# Patient Record
Sex: Female | Born: 1980 | Race: White | Hispanic: No | Marital: Married | Smoking: Never smoker
Health system: Southern US, Community
[De-identification: ages and names within clinical notes are randomized; demographics above are authoritative.]

## PROBLEM LIST (undated history)

## (undated) HISTORY — PX: KIDNEY SURGERY: SHX687

## (undated) HISTORY — PX: CHOLECYSTECTOMY: SHX55

---

## 2005-02-18 ENCOUNTER — Emergency Department: Payer: Self-pay | Admitting: Internal Medicine

## 2006-03-21 ENCOUNTER — Emergency Department: Payer: Self-pay | Admitting: Emergency Medicine

## 2006-03-22 ENCOUNTER — Ambulatory Visit: Payer: Self-pay | Admitting: Emergency Medicine

## 2006-03-29 ENCOUNTER — Ambulatory Visit: Payer: Self-pay

## 2006-04-03 ENCOUNTER — Ambulatory Visit: Payer: Self-pay | Admitting: Unknown Physician Specialty

## 2006-11-13 ENCOUNTER — Ambulatory Visit: Payer: Self-pay | Admitting: Obstetrics & Gynecology

## 2006-11-20 ENCOUNTER — Ambulatory Visit: Payer: Self-pay | Admitting: Obstetrics & Gynecology

## 2006-12-17 ENCOUNTER — Encounter: Payer: Self-pay | Admitting: Maternal & Fetal Medicine

## 2006-12-24 ENCOUNTER — Encounter: Payer: Self-pay | Admitting: Maternal & Fetal Medicine

## 2006-12-31 ENCOUNTER — Encounter: Payer: Self-pay | Admitting: Maternal & Fetal Medicine

## 2006-12-31 ENCOUNTER — Observation Stay: Payer: Self-pay | Admitting: Obstetrics and Gynecology

## 2007-01-14 ENCOUNTER — Encounter: Payer: Self-pay | Admitting: Maternal & Fetal Medicine

## 2007-01-20 ENCOUNTER — Inpatient Hospital Stay: Payer: Self-pay

## 2012-10-14 ENCOUNTER — Emergency Department: Payer: Self-pay | Admitting: Emergency Medicine

## 2012-10-14 LAB — URINALYSIS, COMPLETE
Glucose,UR: NEGATIVE mg/dL (ref 0–75)
Hyaline Cast: 4
Leukocyte Esterase: NEGATIVE
Protein: 30
Specific Gravity: 1.035 (ref 1.003–1.030)
WBC UR: 8 /HPF (ref 0–5)

## 2012-10-14 LAB — COMPREHENSIVE METABOLIC PANEL
Alkaline Phosphatase: 78 U/L (ref 50–136)
Anion Gap: 8 (ref 7–16)
BUN: 12 mg/dL (ref 7–18)
Bilirubin,Total: 0.4 mg/dL (ref 0.2–1.0)
Chloride: 102 mmol/L (ref 98–107)
Co2: 26 mmol/L (ref 21–32)
EGFR (African American): >60
EGFR (Non-African Amer.): >60
Glucose: 98 mg/dL (ref 65–99)
Osmolality: 272 (ref 275–301)
Potassium: 3.7 mmol/L (ref 3.5–5.1)
SGPT (ALT): 15 U/L (ref 12–78)
Sodium: 136 mmol/L (ref 136–145)

## 2012-10-14 LAB — CBC
MCH: 30.2 pg (ref 26.0–34.0)
Platelet: 168 10*3/uL (ref 150–440)
RBC: 4.85 10*6/uL (ref 3.80–5.20)
RDW: 13.3 % (ref 11.5–14.5)
WBC: 7.3 10*3/uL (ref 3.6–11.0)

## 2012-10-14 LAB — RAPID INFLUENZA A&B ANTIGENS

## 2014-06-14 ENCOUNTER — Ambulatory Visit: Payer: Self-pay

## 2019-12-20 ENCOUNTER — Other Ambulatory Visit: Payer: Self-pay

## 2019-12-20 ENCOUNTER — Ambulatory Visit: Payer: Self-pay | Attending: Internal Medicine

## 2019-12-20 ENCOUNTER — Ambulatory Visit: Payer: Self-pay

## 2019-12-20 DIAGNOSIS — Z23 Encounter for immunization: Secondary | ICD-10-CM | POA: Insufficient documentation

## 2019-12-20 NOTE — Progress Notes (Signed)
   Covid-19 Vaccination Clinic  Name:  Carrie Webb    MRN: [id number] DOB: [birth year]  12/20/2019  Carrie Webb was observed post Covid-19 immunization for 15 minutes without incidence. She was provided with Vaccine Information Sheet and instruction to access the V-Safe system.   Carrie Webb was instructed to call 911 with any severe reactions post vaccine: Marland Kitchen Difficulty breathing  . Swelling of your face and throat  . A fast heartbeat  . A bad rash all over your body  . Dizziness and weakness    Immunizations Administered    Name Date Dose VIS Date Route   Moderna COVID-19 Vaccine 12/20/2019  2:00 PM 0.5 mL 09/23/2019 Intramuscular   Manufacturer: Moderna   Lot: [id number]   NDC: 73220-254-27

## 2020-01-17 ENCOUNTER — Ambulatory Visit: Payer: Self-pay | Attending: Internal Medicine

## 2020-01-17 DIAGNOSIS — Z23 Encounter for immunization: Secondary | ICD-10-CM

## 2020-01-17 NOTE — Progress Notes (Signed)
   Covid-19 Vaccination Clinic  Name:  Carrie Webb    MRN: [id number] DOB: 06-28-1981  01/17/2020  Carrie Webb was observed post Covid-19 immunization for 15 minutes without incident. She was provided with Vaccine Information Sheet and instruction to access the V-Safe system.   Carrie Webb was instructed to call 911 with any severe reactions post vaccine: Marland Kitchen Difficulty breathing  . Swelling of face and throat  . A fast heartbeat  . A bad rash all over body  . Dizziness and weakness   Immunizations Administered    Name Date Dose VIS Date Route   Moderna COVID-19 Vaccine 01/17/2020 10:19 AM 0.5 mL 09/23/2019 Intramuscular   Manufacturer: Gala Murdoch   Lot: [id number]   NDC: 22979-892-11

## 2020-03-12 ENCOUNTER — Observation Stay
Admission: EM | Admit: 2020-03-12 | Discharge: 2020-03-14 | Disposition: A | Discharge status: Home / Self Care | Payer: 59 | Attending: Surgery | Admitting: Surgery

## 2020-03-12 ENCOUNTER — Encounter: Payer: Self-pay | Admitting: Medical Oncology

## 2020-03-12 ENCOUNTER — Other Ambulatory Visit: Payer: Self-pay

## 2020-03-12 ENCOUNTER — Ambulatory Visit
Admission: EM | Admit: 2020-03-12 | Discharge: 2020-03-12 | Disposition: A | Discharge status: Home / Self Care | Payer: 59 | Source: Home / Self Care | Attending: Family Medicine | Admitting: Family Medicine

## 2020-03-12 ENCOUNTER — Encounter: Payer: Self-pay | Admitting: Emergency Medicine

## 2020-03-12 ENCOUNTER — Emergency Department: Payer: 59

## 2020-03-12 DIAGNOSIS — Z886 Allergy status to analgesic agent status: Secondary | ICD-10-CM | POA: Diagnosis not present

## 2020-03-12 DIAGNOSIS — R198 Other specified symptoms and signs involving the digestive system and abdomen: Secondary | ICD-10-CM

## 2020-03-12 DIAGNOSIS — R1011 Right upper quadrant pain: Secondary | ICD-10-CM

## 2020-03-12 DIAGNOSIS — K8020 Calculus of gallbladder without cholecystitis without obstruction: Secondary | ICD-10-CM | POA: Diagnosis present

## 2020-03-12 DIAGNOSIS — K8010 Calculus of gallbladder with chronic cholecystitis without obstruction: Principal | ICD-10-CM | POA: Insufficient documentation

## 2020-03-12 DIAGNOSIS — R112 Nausea with vomiting, unspecified: Secondary | ICD-10-CM

## 2020-03-12 DIAGNOSIS — K810 Acute cholecystitis: Secondary | ICD-10-CM | POA: Diagnosis present

## 2020-03-12 DIAGNOSIS — Z880 Allergy status to penicillin: Secondary | ICD-10-CM | POA: Diagnosis not present

## 2020-03-12 DIAGNOSIS — Z20822 Contact with and (suspected) exposure to covid-19: Secondary | ICD-10-CM | POA: Diagnosis not present

## 2020-03-12 LAB — CBC
HCT: 42.3 % (ref 36.0–46.0)
Hemoglobin: 14.3 g/dL (ref 12.0–15.0)
MCH: 30.4 pg (ref 26.0–34.0)
MCHC: 33.8 g/dL (ref 30.0–36.0)
MCV: 90 fL (ref 80.0–100.0)
Platelets: 249 10*3/uL (ref 150–400)
RBC: 4.7 MIL/uL (ref 3.87–5.11)
RDW: 13.1 % (ref 11.5–15.5)
WBC: 9.4 10*3/uL (ref 4.0–10.5)
nRBC: 0 % (ref 0.0–0.2)

## 2020-03-12 LAB — URINALYSIS, COMPLETE (UACMP) WITH MICROSCOPIC
Bilirubin Urine: NEGATIVE
Glucose, UA: NEGATIVE mg/dL
Ketones, ur: NEGATIVE mg/dL
Leukocytes,Ua: NEGATIVE
Nitrite: NEGATIVE
Protein, ur: NEGATIVE mg/dL
Specific Gravity, Urine: 1.025 (ref 1.005–1.030)
pH: 7 (ref 5.0–8.0)

## 2020-03-12 LAB — COMPREHENSIVE METABOLIC PANEL
ALT: 11 U/L (ref 0–44)
AST: 15 U/L (ref 15–41)
Albumin: 4.3 g/dL (ref 3.5–5.0)
Alkaline Phosphatase: 59 U/L (ref 38–126)
Anion gap: 10 (ref 5–15)
BUN: 12 mg/dL (ref 6–20)
CO2: 25 mmol/L (ref 22–32)
Calcium: 9 mg/dL (ref 8.9–10.3)
Chloride: 103 mmol/L (ref 98–111)
Creatinine, Ser: 0.82 mg/dL (ref 0.44–1.00)
GFR calc Af Amer: >60 mL/min (ref >60)
GFR calc non Af Amer: >60 mL/min (ref >60)
Glucose, Bld: 99 mg/dL (ref 70–99)
Potassium: 3.7 mmol/L (ref 3.5–5.1)
Sodium: 138 mmol/L (ref 135–145)
Total Bilirubin: 0.6 mg/dL (ref 0.3–1.2)
Total Protein: 8.2 g/dL — ABNORMAL HIGH (ref 6.5–8.1)

## 2020-03-12 LAB — POCT PREGNANCY, URINE: Preg Test, Ur: NEGATIVE

## 2020-03-12 LAB — LIPASE, BLOOD: Lipase: 24 U/L (ref 11–51)

## 2020-03-12 LAB — SARS CORONAVIRUS 2 BY RT PCR (HOSPITAL ORDER, PERFORMED IN ~~LOC~~ HOSPITAL LAB): SARS Coronavirus 2: NEGATIVE

## 2020-03-12 MED ORDER — SODIUM CHLORIDE 0.9 % IV SOLN: INTRAVENOUS | Status: DC

## 2020-03-12 MED ORDER — METRONIDAZOLE IN NACL 5-0.79 MG/ML-% IV SOLN
500.0000 mg | Freq: Once | INTRAVENOUS | Status: AC
  Administered 2020-03-12: 500 mg via INTRAVENOUS
  Filled 2020-03-12: qty 100

## 2020-03-12 MED ORDER — ONDANSETRON 4 MG PO TBDP
4.0000 mg | ORAL_TABLET | Freq: Once | ORAL | Status: AC | PRN
  Administered 2020-03-12: 4 mg via ORAL
  Filled 2020-03-12: qty 1

## 2020-03-12 MED ORDER — PROCHLORPERAZINE MALEATE 10 MG PO TABS
10.0000 mg | ORAL_TABLET | Freq: Four times a day (QID) | ORAL | Status: DC | PRN
  Filled 2020-03-12: qty 1

## 2020-03-12 MED ORDER — PANTOPRAZOLE SODIUM 40 MG IV SOLR
40.0000 mg | Freq: Every day | INTRAVENOUS | Status: DC
  Administered 2020-03-12 – 2020-03-13 (×2): 40 mg via INTRAVENOUS
  Filled 2020-03-12 (×3): qty 40

## 2020-03-12 MED ORDER — OXYCODONE HCL 5 MG PO TABS
5.0000 mg | ORAL_TABLET | ORAL | Status: DC | PRN
  Administered 2020-03-13: 5 mg via ORAL
  Filled 2020-03-12: qty 1
  Filled 2020-03-12: qty 2

## 2020-03-12 MED ORDER — HYDROMORPHONE HCL 1 MG/ML IJ SOLN
0.5000 mg | INTRAMUSCULAR | Status: DC | PRN
  Filled 2020-03-12: qty 1

## 2020-03-12 MED ORDER — ONDANSETRON HCL 4 MG/2ML IJ SOLN: 4.0000 mg | Freq: Four times a day (QID) | INTRAMUSCULAR | Status: DC | PRN

## 2020-03-12 MED ORDER — HYDRALAZINE HCL 20 MG/ML IJ SOLN: 10.0000 mg | INTRAMUSCULAR | Status: DC | PRN

## 2020-03-12 MED ORDER — MORPHINE SULFATE (PF) 2 MG/ML IV SOLN: 2.0000 mg | INTRAVENOUS | Status: DC | PRN

## 2020-03-12 MED ORDER — ACETAMINOPHEN 500 MG PO TABS
1000.0000 mg | ORAL_TABLET | Freq: Four times a day (QID) | ORAL | Status: DC
  Administered 2020-03-12 – 2020-03-14 (×5): 1000 mg via ORAL
  Filled 2020-03-12 (×6): qty 2

## 2020-03-12 MED ORDER — HEPARIN SODIUM (PORCINE) 5000 UNIT/ML IJ SOLN
5000.0000 [IU] | Freq: Three times a day (TID) | INTRAMUSCULAR | Status: DC
  Administered 2020-03-12 – 2020-03-14 (×4): 5000 [IU] via SUBCUTANEOUS
  Filled 2020-03-12 (×7): qty 1

## 2020-03-12 MED ORDER — PROCHLORPERAZINE EDISYLATE 10 MG/2ML IJ SOLN
5.0000 mg | Freq: Four times a day (QID) | INTRAMUSCULAR | Status: DC | PRN
  Administered 2020-03-12: 10 mg via INTRAVENOUS
  Filled 2020-03-12: qty 2
  Filled 2020-03-12: qty 1
  Filled 2020-03-12: qty 2

## 2020-03-12 MED ORDER — ONDANSETRON 4 MG PO TBDP: 4.0000 mg | ORAL_TABLET | Freq: Four times a day (QID) | ORAL | Status: DC | PRN

## 2020-03-12 MED ORDER — ONDANSETRON HCL 4 MG/2ML IJ SOLN
4.0000 mg | Freq: Once | INTRAMUSCULAR | Status: AC
  Administered 2020-03-12: 4 mg via INTRAVENOUS
  Filled 2020-03-12: qty 2

## 2020-03-12 MED ORDER — DIPHENHYDRAMINE HCL 50 MG/ML IJ SOLN: 12.5000 mg | Freq: Four times a day (QID) | INTRAMUSCULAR | Status: DC | PRN

## 2020-03-12 MED ORDER — CIPROFLOXACIN IN D5W 400 MG/200ML IV SOLN
400.0000 mg | Freq: Once | INTRAVENOUS | Status: DC
  Filled 2020-03-12: qty 200

## 2020-03-12 MED ORDER — CIPROFLOXACIN IN D5W 400 MG/200ML IV SOLN
400.0000 mg | Freq: Two times a day (BID) | INTRAVENOUS | Status: DC
  Administered 2020-03-12 – 2020-03-14 (×4): 400 mg via INTRAVENOUS
  Filled 2020-03-12 (×5): qty 200

## 2020-03-12 MED ORDER — SODIUM CHLORIDE 0.9 % IV BOLUS
1000.0000 mL | Freq: Once | INTRAVENOUS | Status: AC
  Administered 2020-03-12: 1000 mL via INTRAVENOUS

## 2020-03-12 MED ORDER — DIPHENHYDRAMINE HCL 12.5 MG/5ML PO ELIX
12.5000 mg | ORAL_SOLUTION | Freq: Four times a day (QID) | ORAL | Status: DC | PRN
  Filled 2020-03-12: qty 5

## 2020-03-12 MED ORDER — KETOROLAC TROMETHAMINE 15 MG/ML IJ SOLN
15.0000 mg | Freq: Four times a day (QID) | INTRAMUSCULAR | Status: DC | PRN
  Administered 2020-03-12: 15 mg via INTRAVENOUS
  Filled 2020-03-12 (×2): qty 1

## 2020-03-12 MED ORDER — OXYCODONE-ACETAMINOPHEN 5-325 MG PO TABS
1.0000 | ORAL_TABLET | ORAL | Status: DC | PRN
  Administered 2020-03-12: 1 via ORAL
  Filled 2020-03-12: qty 1

## 2020-03-12 NOTE — ED Triage Notes (Signed)
Patient c/o right upper abdominal pain that radiates to her right sided back that started yesterday.  Patient denies fevers.  Patient reports some N/V.

## 2020-03-12 NOTE — ED Notes (Signed)
Assigned bed @ 1735, spoke with RN Amy 

## 2020-03-12 NOTE — Discharge Instructions (Addendum)
Recommend patient go to Emergency Department for further evaluation and management °

## 2020-03-12 NOTE — ED Provider Notes (Signed)
[facility] Emergency Department Provider Note  ____________________________________________   First MD Initiated Contact with Patient 03/12/20 1613     (approximate)  I have reviewed the triage vital signs and the nursing notes.   HISTORY  Chief Complaint Abdominal Pain and Nausea    HPI Carrie Webb is a 39 y.o. female prior C-section with tubal ligation who comes in with abdominal pain.  Patient reports right upper abdominal pain that started yesterday, severe, constant, nothing makes better, nothing makes it worse.  Has been associate with some nausea and vomiting.  Denies ever having this previously.  Denies any chest pain, shortness of breath          History reviewed. No pertinent past medical history.  There are no problems to display for this patient.   Past Surgical History:  Procedure Laterality Date  . CESAREAN SECTION    . KIDNEY SURGERY      Prior to Admission medications   Not on File    Allergies Aspirin and Penicillin g  Family History  Problem Relation Age of Onset  . Healthy Mother   . Healthy Father     Social History Social History   Tobacco Use  . Smoking status: Never Smoker  . Smokeless tobacco: Never Used  Substance Use Topics  . Alcohol use: Not Currently  . Drug use: Never      Review of Systems Constitutional: No fever/chills Eyes: No visual changes. ENT: No sore throat. Cardiovascular: Denies chest pain. Respiratory: Denies shortness of breath. Gastrointestinal: Positive abdominal pain, nausea, vomiting no diarrhea.  No constipation. Genitourinary: Negative for dysuria. Musculoskeletal: Negative for back pain. Skin: Negative for rash. Neurological: Negative for headaches, focal weakness or numbness. All other ROS negative ____________________________________________   PHYSICAL EXAM:  VITAL SIGNS: ED Triage Vitals [03/12/20 1404]  Enc Vitals Group     BP 120/76     Pulse Rate 71      Resp 18     Temp 98.7 F (37.1 C)     Temp Source Oral     SpO2 96 %     Weight 160 lb (72.6 kg)     Height 5\' 4"  (1.626 m)     Head Circumference      Peak Flow      Pain Score 8     Pain Loc      Pain Edu?      Excl. in GC?     Constitutional: Alert and oriented. Well appearing and in no acute distress. Eyes: Conjunctivae are normal. EOMI. Head: Atraumatic. Nose: No congestion/rhinnorhea. Mouth/Throat: Mucous membranes are moist.   Neck: No stridor. Trachea Midline. FROM Cardiovascular: Normal rate, regular rhythm. Grossly normal heart sounds.  Good peripheral circulation. Respiratory: Normal respiratory effort.  No retractions. Lungs CTAB. Gastrointestinal: Positive right upper quadrant pain no distention. No abdominal bruits.  Musculoskeletal: No lower extremity tenderness nor edema.  No joint effusions. Neurologic:  Normal speech and language. No gross focal neurologic deficits are appreciated.  Skin:  Skin is warm, dry and intact. No rash noted. Psychiatric: Mood and affect are normal. Speech and behavior are normal. GU: Deferred   ____________________________________________   LABS (all labs ordered are listed, but only abnormal results are displayed)  Labs Reviewed  COMPREHENSIVE METABOLIC PANEL - Abnormal; Notable for the following components:      Result Value   Total Protein 8.2 (*)    All other components within normal limits  SARS CORONAVIRUS 2 BY  RT PCR (HOSPITAL ORDER, [facility])  LIPASE, BLOOD  CBC  URINALYSIS, COMPLETE (UACMP) WITH MICROSCOPIC  POC URINE PREG, ED   ____________________________________________   ED ECG REPORT I, Vanessa Spencer, the attending physician, personally viewed and interpreted this ECG.  Normal sinus rate of 77, no ST elevation, T wave version in lead III, normal intervals ____________________________________________  RADIOLOGY   Official radiology report(s): US ABDOMEN LIMITED  RUQ  Result Date: 03/12/2020 CLINICAL DATA:  Right upper quadrant pain. EXAM: ULTRASOUND ABDOMEN LIMITED RIGHT UPPER QUADRANT COMPARISON:  No prior. FINDINGS: Gallbladder: 8 mm stone noted in the neck of the gallbladder. 3 mm polyp noted. Gallbladder wall thickness 2.0 mm. Positive Murphy sign. Common bile duct: Diameter: 4.7 mm Liver: Increased echogenicity consistent fatty infiltration or hepatocellular disease. Portal vein is patent on color Doppler imaging with normal direction of blood flow towards the liver. Other: None. IMPRESSION: 1. 8 mm stone noted the neck of the gallbladder. 3 mm polyp. No gallbladder wall thickening. Positive ultrasound Murphy sign. No biliary distention. 2. Increased hepatic echogenicity consistent with fatty infiltration or hepatocellular disease. Electronically Signed   By: Marcello Moores  Register   On: 03/12/2020 16:10    ____________________________________________   PROCEDURES  Procedure(s) performed (including Critical Care):  Procedures   ____________________________________________   INITIAL IMPRESSION / ASSESSMENT AND PLAN / ED COURSE  Carrie Webb was evaluated in Emergency Department on 03/12/2020 for the symptoms described in the history of present illness. She was evaluated in the context of the global COVID-19 pandemic, which necessitated consideration that the patient might be at risk for infection with the SARS-CoV-2 virus that causes COVID-19. Institutional protocols and algorithms that pertain to the evaluation of patients at risk for COVID-19 are in a state of rapid change based on information released by regulatory bodies including the CDC and federal and state organizations. These policies and algorithms were followed during the patient's care in the ED.    Patient is a 39 year old who comes in with right upper quadrant pain.  Will get ultrasound evaluate for cholecystitis, gallstone, labs evaluate for choledocholithiasis.  No lower abdominal pain  to suggest appendicitis, SBO  Labs are reassuring.  Ultrasound shows gallstone with positive Murphy sign.  Given patient's continued pain and nausea and vomiting for almost 24 hours will discuss with the surgery team.  Discussed with Dr. Dahlia Byes who will admit patient for cholecystectomy       ____________________________________________   FINAL CLINICAL IMPRESSION(S) / ED DIAGNOSES   Final diagnoses:  RUQ pain  Calculus of gallbladder without cholecystitis without obstruction  Nausea and vomiting, intractability of vomiting not specified, unspecified vomiting type      MEDICATIONS GIVEN DURING THIS VISIT:  Medications  sodium chloride 0.9 % bolus 1,000 mL (has no administration in time range)  HYDROmorphone (DILAUDID) injection 0.5 mg (has no administration in time range)  ondansetron (ZOFRAN) injection 4 mg (has no administration in time range)  metroNIDAZOLE (FLAGYL) IVPB 500 mg (has no administration in time range)  ciprofloxacin (CIPRO) IVPB 400 mg (has no administration in time range)  ondansetron (ZOFRAN-ODT) disintegrating tablet 4 mg (4 mg Oral Given 03/12/20 1419)     ED Discharge Orders    None       Note:  This document was prepared using Dragon voice recognition software and may include unintentional dictation errors.   Vanessa Stanton, MD 03/12/20 [phone]

## 2020-03-12 NOTE — ED Triage Notes (Signed)
Pt c/o rt upper quad abd pain that began yesterday and radiates around to rt flank. Pt reports nausea and vomiting. Pt denies fever.

## 2020-03-12 NOTE — ED Notes (Signed)
Report called to [facility] rn floor nurse.

## 2020-03-13 ENCOUNTER — Encounter
Admission: EM | Disposition: A | Discharge status: Home / Self Care | Payer: Self-pay | Source: Home / Self Care | Attending: Emergency Medicine

## 2020-03-13 ENCOUNTER — Observation Stay: Payer: 59 | Admitting: Anesthesiology

## 2020-03-13 DIAGNOSIS — K810 Acute cholecystitis: Secondary | ICD-10-CM | POA: Diagnosis not present

## 2020-03-13 LAB — CBC
HCT: 35 % — ABNORMAL LOW (ref 36.0–46.0)
Hemoglobin: 11.8 g/dL — ABNORMAL LOW (ref 12.0–15.0)
MCH: 30.6 pg (ref 26.0–34.0)
MCHC: 33.7 g/dL (ref 30.0–36.0)
MCV: 90.7 fL (ref 80.0–100.0)
Platelets: 191 10*3/uL (ref 150–400)
RBC: 3.86 MIL/uL — ABNORMAL LOW (ref 3.87–5.11)
RDW: 13.1 % (ref 11.5–15.5)
WBC: 5.6 10*3/uL (ref 4.0–10.5)
nRBC: 0 % (ref 0.0–0.2)

## 2020-03-13 LAB — COMPREHENSIVE METABOLIC PANEL
ALT: 9 U/L (ref 0–44)
AST: 13 U/L — ABNORMAL LOW (ref 15–41)
Albumin: 3 g/dL — ABNORMAL LOW (ref 3.5–5.0)
Alkaline Phosphatase: 38 U/L (ref 38–126)
Anion gap: 5 (ref 5–15)
BUN: 8 mg/dL (ref 6–20)
CO2: 23 mmol/L (ref 22–32)
Calcium: 7.8 mg/dL — ABNORMAL LOW (ref 8.9–10.3)
Chloride: 110 mmol/L (ref 98–111)
Creatinine, Ser: 0.77 mg/dL (ref 0.44–1.00)
GFR calc Af Amer: >60 mL/min (ref >60)
GFR calc non Af Amer: >60 mL/min (ref >60)
Glucose, Bld: 104 mg/dL — ABNORMAL HIGH (ref 70–99)
Potassium: 3.6 mmol/L (ref 3.5–5.1)
Sodium: 138 mmol/L (ref 135–145)
Total Bilirubin: 0.8 mg/dL (ref 0.3–1.2)
Total Protein: 5.6 g/dL — ABNORMAL LOW (ref 6.5–8.1)

## 2020-03-13 LAB — HIV ANTIBODY (ROUTINE TESTING W REFLEX): HIV Screen 4th Generation wRfx: NONREACTIVE

## 2020-03-13 SURGERY — CHOLECYSTECTOMY, ROBOT-ASSISTED, LAPAROSCOPIC
Anesthesia: General

## 2020-03-13 MED ORDER — FENTANYL CITRATE (PF) 100 MCG/2ML IJ SOLN
INTRAMUSCULAR | Status: AC
  Filled 2020-03-13: qty 2

## 2020-03-13 MED ORDER — MIDAZOLAM HCL 2 MG/2ML IJ SOLN
INTRAMUSCULAR | Status: AC
  Filled 2020-03-13: qty 2

## 2020-03-13 MED ORDER — ESMOLOL HCL 100 MG/10ML IV SOLN
INTRAVENOUS | Status: DC | PRN
Start: 2020-03-13 — End: 2020-03-13
  Administered 2020-03-13: 10 mg via INTRAVENOUS

## 2020-03-13 MED ORDER — FENTANYL CITRATE (PF) 100 MCG/2ML IJ SOLN: 25.0000 ug | INTRAMUSCULAR | Status: DC | PRN

## 2020-03-13 MED ORDER — DEXAMETHASONE SODIUM PHOSPHATE 10 MG/ML IJ SOLN
INTRAMUSCULAR | Status: AC
  Filled 2020-03-13: qty 1

## 2020-03-13 MED ORDER — BUPIVACAINE-EPINEPHRINE (PF) 0.25% -1:200000 IJ SOLN
INTRAMUSCULAR | Status: DC | PRN
  Administered 2020-03-13: 30 mL

## 2020-03-13 MED ORDER — DEXMEDETOMIDINE HCL IN NACL 80 MCG/20ML IV SOLN
INTRAVENOUS | Status: AC
  Filled 2020-03-13: qty 20

## 2020-03-13 MED ORDER — MIDAZOLAM HCL 2 MG/2ML IJ SOLN
INTRAMUSCULAR | Status: DC | PRN
  Administered 2020-03-13: 2 mg via INTRAVENOUS

## 2020-03-13 MED ORDER — DEXMEDETOMIDINE HCL IN NACL 200 MCG/50ML IV SOLN
INTRAVENOUS | Status: DC | PRN
  Administered 2020-03-13: 12 ug via INTRAVENOUS

## 2020-03-13 MED ORDER — GLYCOPYRROLATE 0.2 MG/ML IJ SOLN
INTRAMUSCULAR | Status: AC
  Filled 2020-03-13: qty 1

## 2020-03-13 MED ORDER — PROMETHAZINE HCL 25 MG/ML IJ SOLN: 6.2500 mg | INTRAMUSCULAR | Status: DC | PRN

## 2020-03-13 MED ORDER — OXYCODONE HCL 5 MG PO TABS: 5.0000 mg | ORAL_TABLET | Freq: Once | ORAL | Status: DC | PRN

## 2020-03-13 MED ORDER — LACTATED RINGERS IV SOLN: INTRAVENOUS | Status: DC | PRN

## 2020-03-13 MED ORDER — PHENYLEPHRINE HCL (PRESSORS) 10 MG/ML IV SOLN
INTRAVENOUS | Status: DC | PRN
  Administered 2020-03-13 (×3): 100 ug via INTRAVENOUS

## 2020-03-13 MED ORDER — LIDOCAINE HCL (CARDIAC) PF 100 MG/5ML IV SOSY
PREFILLED_SYRINGE | INTRAVENOUS | Status: DC | PRN
  Administered 2020-03-13: 80 mg via INTRAVENOUS

## 2020-03-13 MED ORDER — SUGAMMADEX SODIUM 500 MG/5ML IV SOLN
INTRAVENOUS | Status: AC
  Filled 2020-03-13: qty 5

## 2020-03-13 MED ORDER — ONDANSETRON HCL 4 MG/2ML IJ SOLN
INTRAMUSCULAR | Status: AC
  Filled 2020-03-13: qty 2

## 2020-03-13 MED ORDER — ROCURONIUM BROMIDE 100 MG/10ML IV SOLN
INTRAVENOUS | Status: DC | PRN
  Administered 2020-03-13: 20 mg via INTRAVENOUS
  Administered 2020-03-13: 50 mg via INTRAVENOUS

## 2020-03-13 MED ORDER — DEXAMETHASONE SODIUM PHOSPHATE 10 MG/ML IJ SOLN
INTRAMUSCULAR | Status: DC | PRN
  Administered 2020-03-13: 10 mg via INTRAVENOUS

## 2020-03-13 MED ORDER — INDOCYANINE GREEN 25 MG IV SOLR
5.0000 mg | Freq: Once | INTRAVENOUS | Status: AC
  Administered 2020-03-13: 5 mg via INTRAVENOUS
  Filled 2020-03-13: qty 10

## 2020-03-13 MED ORDER — OXYCODONE HCL 5 MG/5ML PO SOLN: 5.0000 mg | Freq: Once | ORAL | Status: DC | PRN

## 2020-03-13 MED ORDER — FENTANYL CITRATE (PF) 100 MCG/2ML IJ SOLN
INTRAMUSCULAR | Status: DC | PRN
  Administered 2020-03-13 (×2): 50 ug via INTRAVENOUS

## 2020-03-13 MED ORDER — KETOROLAC TROMETHAMINE 30 MG/ML IJ SOLN
INTRAMUSCULAR | Status: DC | PRN
  Administered 2020-03-13: 30 mg via INTRAVENOUS

## 2020-03-13 MED ORDER — SODIUM CHLORIDE 0.9 % IV SOLN: INTRAVENOUS | Status: DC

## 2020-03-13 MED ORDER — ONDANSETRON HCL 4 MG/2ML IJ SOLN
INTRAMUSCULAR | Status: DC | PRN
  Administered 2020-03-13 (×2): 4 mg via INTRAVENOUS

## 2020-03-13 MED ORDER — ACETAMINOPHEN 10 MG/ML IV SOLN
INTRAVENOUS | Status: DC | PRN
  Administered 2020-03-13: 1000 mg via INTRAVENOUS

## 2020-03-13 MED ORDER — ACETAMINOPHEN 10 MG/ML IV SOLN
INTRAVENOUS | Status: AC
  Filled 2020-03-13: qty 100

## 2020-03-13 MED ORDER — SUGAMMADEX SODIUM 500 MG/5ML IV SOLN
INTRAVENOUS | Status: DC | PRN
  Administered 2020-03-13: 500 mg via INTRAVENOUS

## 2020-03-13 MED ORDER — ESMOLOL HCL 100 MG/10ML IV SOLN
INTRAVENOUS | Status: AC
  Filled 2020-03-13: qty 10

## 2020-03-13 MED ORDER — SCOPOLAMINE 1 MG/3DAYS TD PT72
MEDICATED_PATCH | TRANSDERMAL | Status: DC | PRN
  Administered 2020-03-13: 1 via TRANSDERMAL

## 2020-03-13 MED ORDER — PROPOFOL 10 MG/ML IV BOLUS
INTRAVENOUS | Status: DC | PRN
  Administered 2020-03-13: 160 mg via INTRAVENOUS

## 2020-03-13 MED ORDER — ROCURONIUM BROMIDE 10 MG/ML (PF) SYRINGE
PREFILLED_SYRINGE | INTRAVENOUS | Status: AC
  Filled 2020-03-13: qty 10

## 2020-03-13 MED ORDER — KETOROLAC TROMETHAMINE 30 MG/ML IJ SOLN
INTRAMUSCULAR | Status: AC
  Filled 2020-03-13: qty 1

## 2020-03-13 MED ORDER — GLYCOPYRROLATE 0.2 MG/ML IJ SOLN
INTRAMUSCULAR | Status: DC | PRN
  Administered 2020-03-13: .2 mg via INTRAVENOUS

## 2020-03-13 MED ORDER — SCOPOLAMINE 1 MG/3DAYS TD PT72
MEDICATED_PATCH | TRANSDERMAL | Status: AC
  Filled 2020-03-13: qty 1

## 2020-03-13 SURGICAL SUPPLY — 48 items
CANISTER SUCT 1200ML W/VALVE (MISCELLANEOUS) ×4 IMPLANT
CANNULA REDUC XI 12-8 STAPL (CANNULA) ×1
CANNULA REDUC XI 12-8MM STAPL (CANNULA) ×1
CANNULA REDUCER 12-8 DVNC XI (CANNULA) ×2 IMPLANT
CHLORAPREP W/TINT 26 (MISCELLANEOUS) ×4 IMPLANT
CLIP VESOLOCK MED LG 6/CT (CLIP) ×4 IMPLANT
COVER WAND RF STERILE (DRAPES) ×4 IMPLANT
DECANTER SPIKE VIAL GLASS SM (MISCELLANEOUS) ×4 IMPLANT
DEFOGGER SCOPE WARMER CLEARIFY (MISCELLANEOUS) ×4 IMPLANT
DERMABOND ADVANCED (GAUZE/BANDAGES/DRESSINGS) ×2
DERMABOND ADVANCED .7 DNX12 (GAUZE/BANDAGES/DRESSINGS) ×2 IMPLANT
DRAPE ARM DVNC X/XI (DISPOSABLE) ×8 IMPLANT
DRAPE COLUMN DVNC XI (DISPOSABLE) ×2 IMPLANT
DRAPE DA VINCI XI ARM (DISPOSABLE) ×8
DRAPE DA VINCI XI COLUMN (DISPOSABLE) ×2
ELECT CAUTERY BLADE 6.4 (BLADE) ×4 IMPLANT
ELECT REM PT RETURN 9FT ADLT (ELECTROSURGICAL) ×4
ELECTRODE REM PT RTRN 9FT ADLT (ELECTROSURGICAL) ×2 IMPLANT
GLOVE BIO SURGEON STRL SZ7 (GLOVE) ×8 IMPLANT
GOWN STRL REUS W/ TWL LRG LVL3 (GOWN DISPOSABLE) ×8 IMPLANT
GOWN STRL REUS W/TWL LRG LVL3 (GOWN DISPOSABLE) ×8
IRRIGATION STRYKERFLOW (MISCELLANEOUS) IMPLANT
IRRIGATOR STRYKERFLOW (MISCELLANEOUS)
IV NS 1000ML (IV SOLUTION)
IV NS 1000ML BAXH (IV SOLUTION) IMPLANT
KIT PINK PAD W/HEAD ARE REST (MISCELLANEOUS) ×4
KIT PINK PAD W/HEAD ARM REST (MISCELLANEOUS) ×2 IMPLANT
LABEL OR SOLS (LABEL) ×4 IMPLANT
NEEDLE HYPO 22GX1.5 SAFETY (NEEDLE) ×4 IMPLANT
NS IRRIG 500ML POUR BTL (IV SOLUTION) ×4 IMPLANT
OBTURATOR OPTICAL STANDARD 8MM (TROCAR) ×2
OBTURATOR OPTICAL STND 8 DVNC (TROCAR) ×2
OBTURATOR OPTICALSTD 8 DVNC (TROCAR) ×2 IMPLANT
PACK LAP CHOLECYSTECTOMY (MISCELLANEOUS) ×4 IMPLANT
PENCIL ELECTRO HAND CTR (MISCELLANEOUS) ×4 IMPLANT
POUCH SPECIMEN RETRIEVAL 10MM (ENDOMECHANICALS) ×4 IMPLANT
SEAL CANN UNIV 5-8 DVNC XI (MISCELLANEOUS) ×6 IMPLANT
SEAL XI 5MM-8MM UNIVERSAL (MISCELLANEOUS) ×6
SET TUBE SMOKE EVAC HIGH FLOW (TUBING) ×4 IMPLANT
SOLUTION ELECTROLUBE (MISCELLANEOUS) ×4 IMPLANT
SPONGE LAP 18X18 RF (DISPOSABLE) ×4 IMPLANT
SPONGE LAP 4X18 RFD (DISPOSABLE) ×4 IMPLANT
STAPLER CANNULA SEAL DVNC XI (STAPLE) ×2 IMPLANT
STAPLER CANNULA SEAL XI (STAPLE) ×2
SUT MNCRL AB 4-0 PS2 18 (SUTURE) ×4 IMPLANT
SUT VICRYL 0 AB UR-6 (SUTURE) ×8 IMPLANT
TAPE TRANSPORE STRL 2 31045 (GAUZE/BANDAGES/DRESSINGS) ×4 IMPLANT
TROCAR 130MM GELPORT  DAV (MISCELLANEOUS) ×4 IMPLANT

## 2020-03-13 NOTE — Anesthesia Procedure Notes (Signed)
Procedure Name: Intubation Performed by: Mohammed Kindle, CRNA Pre-anesthesia Checklist: Patient identified, Emergency Drugs available, Suction available and Patient being monitored Patient Re-evaluated:Patient Re-evaluated prior to induction Oxygen Delivery Method: Circle system utilized Preoxygenation: Pre-oxygenation with 100% oxygen Induction Type: IV induction Ventilation: Mask ventilation without difficulty Laryngoscope Size: McGraph and 3 Tube type: Oral Tube size: 6.5 mm Number of attempts: 1 Airway Equipment and Method: Stylet and Oral airway Placement Confirmation: ETT inserted through vocal cords under direct vision,  positive ETCO2 and breath sounds checked- equal and bilateral Secured at: 21 cm Tube secured with: Tape Dental Injury: Teeth and Oropharynx as per pre-operative assessment

## 2020-03-13 NOTE — Transfer of Care (Signed)
Immediate Anesthesia Transfer of Care Note  Patient: Carrie Webb  Procedure(s) Performed: XI ROBOTIC ASSISTED LAPAROSCOPIC CHOLECYSTECTOMY (N/A ) INDOCYANINE GREEN FLUORESCENCE IMAGING (ICG)  Patient Location: PACU  Anesthesia Type:General  Level of Consciousness: awake, drowsy and patient cooperative  Airway & Oxygen Therapy: Patient Spontanous Breathing  Post-op Assessment: Report given to RN and Post -op Vital signs reviewed and stable  Post vital signs: Reviewed and stable  Last Vitals:  Vitals Value Taken Time  BP    Temp    Pulse 109 03/13/20 1458  Resp 17 03/13/20 1458  SpO2 100 % 03/13/20 1458  Vitals shown include unvalidated device data.  Last Pain:  Vitals:   03/13/20 0800  TempSrc:   PainSc: 3       Patients Stated Pain Goal: 4 (03/12/20 1921)  Complications: No apparent anesthesia complications

## 2020-03-13 NOTE — Progress Notes (Signed)
15 minute call to floor. 

## 2020-03-13 NOTE — Op Note (Signed)
Robotic assisted laparoscopic Cholecystectomy  Pre-operative Diagnosis: cholecystitis  Post-operative Diagnosis: same  Procedure:  Robotic assisted laparoscopic Cholecystectomy  Surgeon: Sterling Big, MD FACS  Anesthesia: Gen. with endotracheal tube  Findings:  Cholecystitis   Estimated Blood Loss:5 cc       Specimens: Gallbladder           Complications: none   Procedure Details  The patient was seen again in the Holding Room. The benefits, complications, treatment options, and expected outcomes were discussed with the patient. The risks of bleeding, infection, recurrence of symptoms, failure to resolve symptoms, bile duct damage, bile duct leak, retained common bile duct stone, bowel injury, any of which could require further surgery and/or ERCP, stent, or papillotomy were reviewed with the patient. The likelihood of improving the patient's symptoms with return to their baseline status is good.  The patient and/or family concurred with the proposed plan, giving informed consent.  The patient was taken to Operating Room, identified  and the procedure verified as Laparoscopic Cholecystectomy.  A Time Out was held and the above information confirmed.  Prior to the induction of general anesthesia, antibiotic prophylaxis was administered. VTE prophylaxis was in place. General endotracheal anesthesia was then administered and tolerated well. After the induction, the abdomen was prepped with Chloraprep and draped in the sterile fashion. The patient was positioned in the supine position.  Cut down technique was used to enter the abdominal cavity and a Hasson trochar was placed after two vicryl stitches were anchored to the fascia. Pneumoperitoneum was then created with CO2 and tolerated well without any adverse changes in the patient's vital signs.  Three 8-mm ports were placed under direct vision. All skin incisions  were infiltrated with a local anesthetic agent before making the incision and  placing the trocars.   The patient was positioned  in reverse Trendelenburg, robot was brought to the surgical field and docked in the standard fashion.  We made sure all the instrumentation was kept indirect view at all times and that there were no collision between the arms. I scrubbed out and went to the console.  The gallbladder was identified, the fundus grasped and retracted cephalad. Adhesions were lysed bluntly. The infundibulum was grasped and retracted laterally, exposing the peritoneum overlying the triangle of Calot. This was then divided and exposed in a blunt fashion. An extended critical view of the cystic duct and cystic artery was obtained.  The cystic duct was clearly identified and bluntly dissected.   Artery and duct were double clipped and divided. Using ICG cholangiography we visualize the cystic duct and CBD, no evidence of bile injuries was observed. The gallbladder was taken from the gallbladder fossa in a retrograde fashion with the electrocautery.  Hemostasis was achieved with the electrocautery. nspection of the right upper quadrant was performed. No bleeding, bile duct injury or leak, or bowel injury was noted. Robotic instruments and robotic arms were undocked in the standard fashion.  I scrubbed back in.  The gallbladder was removed and placed in an Endocatch bag.   Pneumoperitoneum was released.  The periumbilical port site was closed with interrumpted 0 Vicryl sutures. 4-0 subcuticular Monocryl was used to close the skin. Dermabond was  applied.  The patient was then extubated and brought to the recovery room in stable condition. Sponge, lap, and needle counts were correct at closure and at the conclusion of the case.               Sterling Big, MD, FACS

## 2020-03-13 NOTE — Anesthesia Preprocedure Evaluation (Addendum)
Anesthesia Evaluation  Patient identified by MRN, date of birth, ID band Patient awake    Reviewed: Allergy & Precautions, H&P , NPO status , Patient's Chart, lab work & pertinent test results  Airway Mallampati: II  TM Distance: >3 FB Neck ROM: full    Dental  (+) Teeth Intact   Pulmonary neg pulmonary ROS,    breath sounds clear to auscultation       Cardiovascular negative cardio ROS   Rhythm:regular Rate:Normal     Neuro/Psych negative neurological ROS  negative psych ROS   GI/Hepatic negative GI ROS, Neg liver ROS,   Endo/Other  negative endocrine ROS  Renal/GU      Musculoskeletal   Abdominal   Peds  Hematology negative hematology ROS (+)   Anesthesia Other Findings History reviewed. No pertinent past medical history.  Past Surgical History: No date: CESAREAN SECTION No date: KIDNEY SURGERY  BMI    Body Mass Index: 27.46 kg/m      Reproductive/Obstetrics negative OB ROS                            Anesthesia Physical Anesthesia Plan  ASA: I  Anesthesia Plan: General ETT   Post-op Pain Management:    Induction:   PONV Risk Score and Plan: Ondansetron, Dexamethasone, Midazolam and Treatment may vary due to age or medical condition  Airway Management Planned:   Additional Equipment:   Intra-op Plan:   Post-operative Plan:   Informed Consent: I have reviewed the patients History and Physical, chart, labs and discussed the procedure including the risks, benefits and alternatives for the proposed anesthesia with the patient or authorized representative who has indicated his/her understanding and acceptance.     Dental Advisory Given  Plan Discussed with: Anesthesiologist, CRNA and Surgeon  Anesthesia Plan Comments:         Anesthesia Quick Evaluation

## 2020-03-13 NOTE — Progress Notes (Addendum)
Pt up to BR, has voided twice, walked 2 laps around nurses station. Tolerated clear liquids, then crackers, has ordered mac & cheese and mashed potatoes for dinner.

## 2020-03-13 NOTE — H&P (Signed)
Patient ID: Carrie Webb, female   DOB: 05-26-1981, 39 y.o.   MRN: [id number]  History of Present Illness Carrie Webb is a 39 y.o. female with 3-day history of epigastric and right upper quadrant pain.  The pain is moderate intensity it got worse after having lunch 2 days ago.  She also had associated nausea and vomiting and decreased appetite.  No fevers, no chills, no evidence of biliary obstruction.  She is otherwise healthy and is able to perform more than 6 METS of activity without any shortness of breath or chest pain.  Ultrasound personally reviewed showing evidence of gallstones without definitive cholecystitis.  No evidence of common bile duct dilation.  CBC and CMP are completely normal.  She has had C-sections in the past and has recovered very well.  Past Medical History History reviewed. No pertinent past medical history.     Past Surgical History:  Procedure Laterality Date  . CESAREAN SECTION    . KIDNEY SURGERY      Allergies  Allergen Reactions  . Aspirin Swelling  . Penicillin G Swelling    Current Facility-Administered Medications  Medication Dose Route Frequency Provider Last Rate Last Admin  . 0.9 %  sodium chloride infusion   Intravenous Continuous Jules Husbands, MD   Stopped at 03/13/20 [phone]  . acetaminophen (TYLENOL) tablet 1,000 mg  1,000 mg Oral Q6H Jaki Steptoe F, MD   1,000 mg at 03/13/20 0616  . ciprofloxacin (CIPRO) IVPB 400 mg  400 mg Intravenous Q12H Caroleen Hamman F, MD 200 mL/hr at 03/13/20 0624 Rate Verify at 03/13/20 0624  . diphenhydrAMINE (BENADRYL) 12.5 MG/5ML elixir 12.5 mg  12.5 mg Oral Q6H PRN Alieu Finnigan F, MD       Or  . diphenhydrAMINE (BENADRYL) injection 12.5 mg  12.5 mg Intravenous Q6H PRN Tereasa Yilmaz F, MD      . heparin injection 5,000 Units  5,000 Units Subcutaneous Q8H Ellouise Mcwhirter, [place] F, MD   5,000 Units at 03/13/20 [phone]  . hydrALAZINE (APRESOLINE) injection 10 mg  10 mg Intravenous Q2H PRN Denzil Mceachron F, MD      . HYDROmorphone  (DILAUDID) injection 0.5 mg  0.5 mg Intravenous Q1H PRN Vanessa , MD      . ketorolac (TORADOL) 15 MG/ML injection 15 mg  15 mg Intravenous Q6H PRN Jules Husbands, MD   15 mg at 03/12/20 1921  . morphine 2 MG/ML injection 2 mg  2 mg Intravenous Q2H PRN Iris Tatsch F, MD      . ondansetron (ZOFRAN-ODT) disintegrating tablet 4 mg  4 mg Oral Q6H PRN Kaitlin Alcindor F, MD       Or  . ondansetron (ZOFRAN) injection 4 mg  4 mg Intravenous Q6H PRN Merwin Breden F, MD      . oxyCODONE (Oxy IR/ROXICODONE) immediate release tablet 5-10 mg  5-10 mg Oral Q4H PRN Nuchem Grattan F, MD      . pantoprazole (PROTONIX) injection 40 mg  40 mg Intravenous QHS Caroleen Hamman F, MD   40 mg at 03/12/20 2152  . prochlorperazine (COMPAZINE) tablet 10 mg  10 mg Oral Q6H PRN Elis Rawlinson F, MD       Or  . prochlorperazine (COMPAZINE) injection 5-10 mg  5-10 mg Intravenous Q6H PRN Jules Husbands, MD   10 mg at 03/12/20 1916    Family History Family History  Problem Relation Age of Onset  . Healthy Mother   . Healthy Father  Social History Social History   Tobacco Use  . Smoking status: Never Smoker  . Smokeless tobacco: Never Used  Substance Use Topics  . Alcohol use: Not Currently  . Drug use: Never     ROS Full ROS of systems performed and is otherwise negative there than what is stated in the HPI  Physical Exam Blood pressure 97/69, pulse 77, temperature 98.5 F (36.9 C), temperature source Oral, resp. rate 18, height 5\' 4"  (1.626 m), weight 72.6 kg, last menstrual period 02/20/2020, SpO2 97 %.  CONSTITUTIONAL: NAD EYES: Pupils equal, round, and reactive to light, Sclera non-icteric. EARS, NOSE, MOUTH AND THROAT: The oropharynx is clear. Oral mucosa is pink and moist. Hearing is intact to voice.  NECK: Trachea is midline, and there is no jugular venous distension. Thyroid is without palpable abnormalities. LYMPH NODES:  Lymph nodes in the neck are not enlarged. RESPIRATORY:  Lungs are clear,  and breath sounds are equal bilaterally. Normal respiratory effort without pathologic use of accessory muscles. CARDIOVASCULAR: Heart is regular without murmurs, gallops, or rubs. GI: The abdomen is  soft, r, and non distended, TTP RUQ, no peritonitis, no Murphy,There were no palpable masses. There was no hepatosplenomegaly. There were normal bowel sounds. MUSCULOSKELETAL:  Normal muscle strength and tone in all four extremities.    SKIN: Skin turgor is normal. There are no pathologic skin lesions.  NEUROLOGIC:  Motor and sensation is grossly normal.  Cranial nerves are grossly intact. PSYCH:  Alert and oriented to person, place and time. Affect is normal.  Data Reviewed I have personally reviewed the patient's imaging and medical records.    Assessment/Plan 39 year old female with classic signs and symptoms consistent with acute cholecystitis.  I do recommend cholecystectomy. The risks, benefits, complications, treatment options, and expected outcomes were discussed with the patient. The possibilities of bleeding, recurrent infection, finding a normal gallbladder, perforation of viscus organs, damage to surrounding structures, bile leak, abscess formation, needing a drain placed, the need for additional procedures, reaction to medication, pulmonary aspiration,  failure to diagnose a condition, the possible need to convert to an open procedure, and creating a complication requiring transfusion or operation were discussed with the patient. The patient and/or family concurred with the proposed plan, giving informed consent.   We will continue IV fluids, antibiotics and will perform her surgery today.   Victorhugo Preis, MD FACS  Carrie Webb 03/13/2020, 9:37 AM

## 2020-03-13 NOTE — Anesthesia Postprocedure Evaluation (Signed)
Anesthesia Post Note  Patient: Carrie Webb  Procedure(s) Performed: XI ROBOTIC ASSISTED LAPAROSCOPIC CHOLECYSTECTOMY (N/A ) INDOCYANINE GREEN FLUORESCENCE IMAGING (ICG)  Patient location during evaluation: PACU Anesthesia Type: General Level of consciousness: awake and alert Pain management: pain level controlled Vital Signs Assessment: post-procedure vital signs reviewed and stable Respiratory status: spontaneous breathing, nonlabored ventilation and respiratory function stable Cardiovascular status: blood pressure returned to baseline and stable Postop Assessment: no apparent nausea or vomiting Anesthetic complications: no     Last Vitals:  Vitals:   03/13/20 1526 03/13/20 1602  BP: 109/67 110/80  Pulse: 91 100  Resp: 15   Temp:  36.4 C  SpO2: 98% 96%    Last Pain:  Vitals:   03/13/20 1526  TempSrc:   PainSc: 3                  Karleen Hampshire

## 2020-03-14 MED ORDER — HYDROCODONE-ACETAMINOPHEN 5-325 MG PO TABS
1.0000 | ORAL_TABLET | Freq: Four times a day (QID) | ORAL | 0 refills | Status: DC | PRN
Start: 2020-03-14 — End: 2020-07-26

## 2020-03-14 NOTE — Progress Notes (Signed)
Discharge instructions complete and prescription given. Patient verbalizes understanding of teaching. Patient discharged home via wheelchair at 1130.

## 2020-03-14 NOTE — Discharge Summary (Signed)
  Patient ID: Carrie Webb MRN: [id number] DOB/AGE: 41-Jun-1982 40 y.o.  Admit date: 03/12/2020 Discharge date: 03/14/2020   Discharge Diagnoses:  Active Problems:   Acute cholecystitis   Procedures:LAP CHOLECYSTECTOMY  Hospital Course:  39 yo female admitted with findings consistent with acute cholecystitis and  was taken promptly to the operating room for an uneventful Robotic laparoscopic cholecystectomy.  Patient was kept overnight.  The time of discharge the patient was ambulating,  pain was controlled.  Her vital signs were stable and she was afebrile.   physical exam at discharge showed a pt  in no acute distress.  Awake and alert.  Abdomen: Soft incisions healing well without infection or peritonitis.  Extremities well-perfused and no edema.  Condition of the patient the time of discharge was stable     Disposition: Discharge disposition: 01-Home or Self Care       Discharge Instructions    Call MD for:  difficulty breathing, headache or visual disturbances   Complete by: As directed    Call MD for:  extreme fatigue   Complete by: As directed    Call MD for:  hives   Complete by: As directed    Call MD for:  persistant dizziness or light-headedness   Complete by: As directed    Call MD for:  persistant nausea and vomiting   Complete by: As directed    Call MD for:  redness, tenderness, or signs of infection (pain, swelling, redness, odor or green/yellow discharge around incision site)   Complete by: As directed    Call MD for:  severe uncontrolled pain   Complete by: As directed    Call MD for:  temperature >100.4   Complete by: As directed    Diet - low sodium heart healthy   Complete by: As directed    Discharge instructions   Complete by: As directed    SHOWER TOMORROW AM   Increase activity slowly   Complete by: As directed    Lifting restrictions   Complete by: As directed    20 LBS X 6 WKS     Allergies as of 03/14/2020      Reactions   Aspirin  Swelling   Penicillin G Swelling      Medication List    TAKE these medications   HYDROcodone-acetaminophen 5-325 MG tablet Commonly known as: NORCO/VICODIN Take 1-2 tablets by mouth every 6 (six) hours as needed for moderate pain.      Follow-up Information    Fransheska Willingham, [place] F, MD Follow up in 2 week(s).   Specialty: General Surgery Why: virtual Contact information: [address] [place] [address] [phone]            Sterling Big, MD FACS

## 2020-03-14 NOTE — Discharge Instructions (Signed)

## 2020-03-16 LAB — SURGICAL PATHOLOGY

## 2020-03-17 ENCOUNTER — Telehealth: Payer: Self-pay | Admitting: Surgery

## 2020-03-17 MED ORDER — GABAPENTIN 300 MG PO CAPS
300.0000 mg | ORAL_CAPSULE | Freq: Three times a day (TID) | ORAL | 0 refills | Status: DC
Start: 2020-03-17 — End: 2020-07-26

## 2020-03-17 NOTE — ED Provider Notes (Signed)
[facility]    CSN: [id number] Arrival date & time: 03/12/20  1227      History   Chief Complaint Chief Complaint  Patient presents with  . Abdominal Pain  . Back Pain    HPI Carrie Webb is a 39 y.o. female.   39 yo female with a c/o right upper abdominal pain since yesterday. States pain began suddenly and has been worsening. Pain radiates around the right upper quadrant towards the back and right shoulder. Has also been having nausea and vomiting. Denies any fevers or chills.    Abdominal Pain Back Pain Associated symptoms: abdominal pain     History reviewed. No pertinent past medical history.  Patient Active Problem List   Diagnosis Date Noted  . Acute cholecystitis 03/12/2020    Past Surgical History:  Procedure Laterality Date  . CESAREAN SECTION    . KIDNEY SURGERY      OB History   No obstetric history on file.      Home Medications    Prior to Admission medications   Medication Sig Start Date End Date Taking? Authorizing Provider  HYDROcodone-acetaminophen (NORCO/VICODIN) 5-325 MG tablet Take 1-2 tablets by mouth every 6 (six) hours as needed for moderate pain. 03/14/20   Leafy Ro, MD    Family History Family History  Problem Relation Age of Onset  . Healthy Mother   . Healthy Father     Social History Social History   Tobacco Use  . Smoking status: Never Smoker  . Smokeless tobacco: Never Used  Substance Use Topics  . Alcohol use: Not Currently  . Drug use: Never     Allergies   Aspirin and Penicillin g   Review of Systems Review of Systems  Gastrointestinal: Positive for abdominal pain.  Musculoskeletal: Positive for back pain.     Physical Exam Triage Vital Signs ED Triage Vitals  Enc Vitals Group     BP 03/12/20 1249 110/86     Pulse Rate 03/12/20 1249 81     Resp 03/12/20 1249 14     Temp 03/12/20 1249 98.8 F (37.1 C)     Temp Source 03/12/20 1249 Oral     SpO2 03/12/20 1249 99 %   Weight 03/12/20 1245 160 lb (72.6 kg)     Height 03/12/20 1245 5\' 4"  (1.626 m)     Head Circumference --      Peak Flow --      Pain Score 03/12/20 1245 8     Pain Loc --      Pain Edu? --      Excl. in GC? --    No data found.  Updated Vital Signs BP 110/86 (BP Location: Left Arm)   Pulse 81   Temp 98.8 F (37.1 C) (Oral)   Resp 14   Ht 5\' 4"  (1.626 m)   Wt 72.6 kg   LMP 02/20/2020 (Approximate)   SpO2 99%   BMI 27.46 kg/m   Visual Acuity Right Eye Distance:   Left Eye Distance:   Bilateral Distance:    Right Eye Near:   Left Eye Near:    Bilateral Near:     Physical Exam Vitals and nursing note reviewed.  Constitutional:      General: She is not in acute distress.    Appearance: She is not toxic-appearing or diaphoretic.  Cardiovascular:     Rate and Rhythm: Normal rate.  Pulmonary:     Effort: Pulmonary effort is normal. No  respiratory distress.  Abdominal:     General: Bowel sounds are normal.     Tenderness: There is abdominal tenderness in the right upper quadrant. There is guarding. There is no right CVA tenderness, left CVA tenderness or rebound. Positive signs include Murphy's sign. Negative signs include McBurney's sign.     Hernia: No hernia is present.  Neurological:     Mental Status: She is alert.      UC Treatments / Results  Labs (all labs ordered are listed, but only abnormal results are displayed) Labs Reviewed  URINALYSIS, COMPLETE (UACMP) WITH MICROSCOPIC - Abnormal; Notable for the following components:      Result Value   Hgb urine dipstick TRACE (*)    Bacteria, UA MANY (*)    All other components within normal limits    EKG   Radiology No results found.  Procedures Procedures (including critical care time)  Medications Ordered in UC Medications - No data to display  Initial Impression / Assessment and Plan / UC Course  I have reviewed the triage vital signs and the nursing notes.  Pertinent labs & imaging results  that were available during my care of the patient were reviewed by me and considered in my medical decision making (see chart for details).     Final Clinical Impressions(s) / UC Diagnoses   Final diagnoses:  Right upper quadrant abdominal pain with positive Murphy's Sign  Nausea and vomiting in adult     Discharge Instructions     Recommend patient go to Emergency Department for further evaluation and management    ED Prescriptions    None      1. diagnosis reviewed with patient; recommend patient go to Emergency Department for further evaluation and management. Patient verbalizes understanding, in stable condition and will proceed by private vehicle with husband driving.    PDMP not reviewed this encounter.   Norval Gable, MD 03/17/20 [phone]

## 2020-03-17 NOTE — Telephone Encounter (Signed)
Called patient. States she had Lap choley 03/13/20 by Dr Everlene Farrier. States she was prescribed Norco and that has gave her hives so she stopped taking it, started taking Tylenol. Pt denies Fevers, chills. Reports nausea, vomiting, and watery stools that started yesterday. Pt states the belly button incision site is the most painful one, denies drainage redness but reports puffiness to it.   Per Dr Everlene Farrier, prescribed pt Gabapentin. Pt advised to alternate it with ibuprofen every 8 hours. Do ice pack to the area. Puffiness should go down with ice packs. Nausea may have been due to Narcotics. Pt virtual appt moved to an earlier day. Pt voiced understanding and has no further concerns.

## 2020-03-17 NOTE — Telephone Encounter (Signed)
Patient calls stating she had her gallbladder removed over the weekend by Dr. Everlene Farrier.  Patient has a few concerns.  First the pain medicine she was given she had to stop as it started breaking her out from chest on up.  Second she is having a lot of pain at the surgical site. States so painful that it make her "want to throw up".  Please call her.  Thank you.

## 2020-03-24 ENCOUNTER — Telehealth (INDEPENDENT_AMBULATORY_CARE_PROVIDER_SITE_OTHER): Payer: 59 | Admitting: Surgery

## 2020-03-24 ENCOUNTER — Encounter: Payer: Self-pay | Admitting: Surgery

## 2020-03-24 ENCOUNTER — Other Ambulatory Visit: Payer: Self-pay

## 2020-03-24 DIAGNOSIS — Z09 Encounter for follow-up examination after completed treatment for conditions other than malignant neoplasm: Secondary | ICD-10-CM

## 2020-03-24 MED ORDER — CHOLESTYRAMINE 4 G PO PACK: 4.0000 g | PACK | Freq: Three times a day (TID) | ORAL | 12 refills | Status: DC

## 2020-03-24 NOTE — Progress Notes (Signed)
I connected w the pt on her cellphone Main concern is diarrhea after po intake No fevers or chills, no pain Mother w similar issues and she responded to Darlen Round prescription sent May f/u 3 weeks virtually

## 2020-03-29 ENCOUNTER — Telehealth: Payer: 59 | Admitting: Surgery

## 2020-04-14 ENCOUNTER — Other Ambulatory Visit: Payer: Self-pay

## 2020-04-14 ENCOUNTER — Telehealth (INDEPENDENT_AMBULATORY_CARE_PROVIDER_SITE_OTHER): Payer: Self-pay | Admitting: Surgery

## 2020-04-14 DIAGNOSIS — Z09 Encounter for follow-up examination after completed treatment for conditions other than malignant neoplasm: Secondary | ICD-10-CM

## 2020-04-16 NOTE — Progress Notes (Signed)
Virtual follow-up.  I called patient on her cell phone.  She is doing well.  Status post cholecystectomy.  No fevers no chills diarrhea has subsided after strength. She has no concerns and wants to follow-up on a as needed basis

## 2020-07-26 ENCOUNTER — Ambulatory Visit
Admission: EM | Admit: 2020-07-26 | Discharge: 2020-07-26 | Disposition: A | Discharge status: Home / Self Care | Payer: 59 | Attending: Family Medicine | Admitting: Family Medicine

## 2020-07-26 ENCOUNTER — Ambulatory Visit: Admit: 2020-07-26 | Discharge status: Absent without leave | Payer: 59

## 2020-07-26 ENCOUNTER — Other Ambulatory Visit: Payer: Self-pay

## 2020-07-26 DIAGNOSIS — R059 Cough, unspecified: Secondary | ICD-10-CM | POA: Insufficient documentation

## 2020-07-26 DIAGNOSIS — Z20822 Contact with and (suspected) exposure to covid-19: Secondary | ICD-10-CM | POA: Insufficient documentation

## 2020-07-26 DIAGNOSIS — Z79899 Other long term (current) drug therapy: Secondary | ICD-10-CM | POA: Diagnosis not present

## 2020-07-26 DIAGNOSIS — Z880 Allergy status to penicillin: Secondary | ICD-10-CM | POA: Insufficient documentation

## 2020-07-26 DIAGNOSIS — Z886 Allergy status to analgesic agent status: Secondary | ICD-10-CM | POA: Diagnosis not present

## 2020-07-26 DIAGNOSIS — K810 Acute cholecystitis: Secondary | ICD-10-CM | POA: Diagnosis not present

## 2020-07-26 DIAGNOSIS — J0100 Acute maxillary sinusitis, unspecified: Secondary | ICD-10-CM | POA: Insufficient documentation

## 2020-07-26 DIAGNOSIS — R0981 Nasal congestion: Secondary | ICD-10-CM | POA: Diagnosis present

## 2020-07-26 MED ORDER — DOXYCYCLINE HYCLATE 100 MG PO CAPS: 100.0000 mg | ORAL_CAPSULE | Freq: Two times a day (BID) | ORAL | 0 refills | Status: DC

## 2020-07-26 NOTE — ED Triage Notes (Signed)
Patient in today w/ c/o sinus congestion and drainage, cough, fever, bilateral ear pain, and fever. Patient states sx onset x 3 days ago.   Patient states she is vaccinated against COVID-19.

## 2020-07-26 NOTE — ED Provider Notes (Signed)
[facility]    CSN: [id number] Arrival date & time: 07/26/20  1455      History   Chief Complaint Chief Complaint  Patient presents with  . Nasal Congestion  . Cough  . Fever   HPI   39 year old female presents with the above complaints.  Patient states that her symptoms started on Friday.  Patient states that she has a history of sinus infections and this feels similar to her prior bouts.  She states that she has had nasal congestion, sinus pain and pressure particularly at the left maxillary region and around the left eye.  She reports that she has had a low-grade temperature, T-max 100.1.  She is also had associated cough and postnasal drip.  She has taken over-the-counter allergy and sinus medication without any relief.  No sick contacts.  She is vaccinated against COVID-19.  No other complaints at this time.   Patient Active Problem List   Diagnosis Date Noted  . Acute cholecystitis 03/12/2020    Past Surgical History:  Procedure Laterality Date  . CESAREAN SECTION    . KIDNEY SURGERY      OB History   No obstetric history on file.      Home Medications    Prior to Admission medications   Medication Sig Start Date End Date Taking? Authorizing Provider  doxycycline (VIBRAMYCIN) 100 MG capsule Take 1 capsule (100 mg total) by mouth 2 (two) times daily. 07/26/20   Tommie Sams, DO  cholestyramine (QUESTRAN) 4 g packet Take 1 packet (4 g total) by mouth 3 (three) times daily with meals. 03/24/20 07/26/20  Pabon, Merri Ray, MD  gabapentin (NEURONTIN) 300 MG capsule Take 1 capsule (300 mg total) by mouth 3 (three) times daily. 03/17/20 07/26/20  Leafy Ro, MD    Family History Family History  Problem Relation Age of Onset  . Healthy Mother   . Healthy Father     Social History Social History   Tobacco Use  . Smoking status: Never Smoker  . Smokeless tobacco: Never Used  Vaping Use  . Vaping Use: Never used  Substance Use Topics  . Alcohol  use: Not Currently  . Drug use: Never     Allergies   Aspirin and Penicillin g   Review of Systems Review of Systems  Constitutional: Positive for fever.  HENT: Positive for congestion, postnasal drip, sinus pressure and sinus pain.   Respiratory: Positive for cough.    Physical Exam Triage Vital Signs ED Triage Vitals  Enc Vitals Group     BP 07/26/20 1557 112/81     Pulse Rate 07/26/20 1557 85     Resp 07/26/20 1557 16     Temp 07/26/20 1557 98.3 F (36.8 C)     Temp Source 07/26/20 1557 Oral     SpO2 07/26/20 1557 99 %     Weight 07/26/20 1559 160 lb (72.6 kg)     Height 07/26/20 1559 5\' 4"  (1.626 m)     Head Circumference --      Peak Flow --      Pain Score 07/26/20 1559 0     Pain Loc --      Pain Edu? --      Excl. in GC? --    Updated Vital Signs BP 112/81 (BP Location: Right Arm)   Pulse 85   Temp 98.3 F (36.8 C) (Oral)   Resp 16   Ht 5\' 4"  (1.626 m)   Wt 72.6  kg   LMP 07/08/2020 (Approximate)   SpO2 99%   BMI 27.46 kg/m   Visual Acuity Right Eye Distance:   Left Eye Distance:   Bilateral Distance:    Right Eye Near:   Left Eye Near:    Bilateral Near:     Physical Exam Vitals and nursing note reviewed.  Constitutional:      General: She is not in acute distress.    Appearance: Normal appearance. She is not ill-appearing.  HENT:     Head: Normocephalic and atraumatic.     Right Ear: Tympanic membrane normal.     Left Ear: Tympanic membrane normal.  Eyes:     General:        Right eye: No discharge.        Left eye: No discharge.     Conjunctiva/sclera: Conjunctivae normal.  Cardiovascular:     Rate and Rhythm: Normal rate and regular rhythm.     Heart sounds: No murmur heard.   Pulmonary:     Effort: Pulmonary effort is normal.     Breath sounds: Normal breath sounds. No wheezing, rhonchi or rales.  Neurological:     Mental Status: She is alert.  Psychiatric:        Mood and Affect: Mood normal.        Behavior: Behavior  normal.    UC Treatments / Results  Labs (all labs ordered are listed, but only abnormal results are displayed) Labs Reviewed  SARS CORONAVIRUS 2 (TAT 6-24 HRS)    EKG   Radiology No results found.  Procedures Procedures (including critical care time)  Medications Ordered in UC Medications - No data to display  Initial Impression / Assessment and Plan / UC Course  I have reviewed the triage vital signs and the nursing notes.  Pertinent labs & imaging results that were available during my care of the patient were reviewed by me and considered in my medical decision making (see chart for details).    39 year old female presents with sinusitis.  Possible COVID-19.  Awaiting test results.  Placing empirically on doxycycline while awaiting test results.  Final Clinical Impressions(s) / UC Diagnoses   Final diagnoses:  Acute maxillary sinusitis, recurrence not specified     Discharge Instructions     Medication as prescribed.  COVID test will be back tomorrow.  Take care  Dr. Adriana Simas    ED Prescriptions    Medication Sig Dispense Auth. Provider   doxycycline (VIBRAMYCIN) 100 MG capsule Take 1 capsule (100 mg total) by mouth 2 (two) times daily. 14 capsule Everlene Other G, DO     PDMP not reviewed this encounter.   Tommie Sams, [place] 07/26/20 1750

## 2020-07-26 NOTE — Discharge Instructions (Signed)
Medication as prescribed.  COVID test will be back tomorrow.  Take care  Dr. Toran Murch  

## 2020-07-27 LAB — SARS CORONAVIRUS 2 (TAT 6-24 HRS): SARS Coronavirus 2: NEGATIVE

## 2021-01-04 ENCOUNTER — Ambulatory Visit: Payer: Self-pay

## 2021-01-04 ENCOUNTER — Ambulatory Visit
Admission: EM | Admit: 2021-01-04 | Discharge: 2021-01-04 | Disposition: A | Discharge status: Home / Self Care | Payer: 59

## 2021-01-04 ENCOUNTER — Other Ambulatory Visit: Payer: Self-pay

## 2021-01-04 DIAGNOSIS — J0110 Acute frontal sinusitis, unspecified: Secondary | ICD-10-CM

## 2021-01-04 MED ORDER — DOXYCYCLINE HYCLATE 100 MG PO CAPS: 100.0000 mg | ORAL_CAPSULE | Freq: Two times a day (BID) | ORAL | 0 refills | Status: DC

## 2021-01-04 NOTE — ED Triage Notes (Signed)
Patient presents to Urgent Care with complaints of right eye drainage, cough, nasal congestion, and sore throat x  Week. Pt treating symptoms with OTC allergy and eye drop meds with no relief.  Denies fever.

## 2021-01-04 NOTE — Discharge Instructions (Addendum)
Treating you for a sinus infection Take the medicines as prescribed OTC medicines as needed.

## 2021-01-04 NOTE — ED Provider Notes (Signed)
[facility]    CSN: [id number] Arrival date & time: 01/04/21  1159      History   Chief Complaint Chief Complaint  Patient presents with  . Cough  . Nasal Congestion  . Sore Throat  . Eye Problem    HPI Carrie Webb [address] [name] is a 40 y.o. female.   Pt is a 40 year old female that presents with sinus congestion, mucous  production, sore throat, cough. This has been for a week and worsening. Taking OTC meds without much relief. Also right eye irritation and mild nose bleed. No fever. Hx of allergies and sinusitis.    Sore Throat  Eye Problem   History reviewed. No pertinent past medical history.  Patient Active Problem List   Diagnosis Date Noted  . Acute cholecystitis 03/12/2020    Past Surgical History:  Procedure Laterality Date  . CESAREAN SECTION    . KIDNEY SURGERY      OB History   No obstetric history on file.      Home Medications    Prior to Admission medications   Medication Sig Start Date End Date Taking? Authorizing Provider  cholestyramine (QUESTRAN) 4 g packet  12/29/20   [provider]  doxycycline (VIBRAMYCIN) 100 MG capsule Take 1 capsule (100 mg total) by mouth 2 (two) times daily for 7 days. 01/04/21 01/11/21  Dahlia Byes A, NP  gabapentin (NEURONTIN) 300 MG capsule Take 1 capsule (300 mg total) by mouth 3 (three) times daily. 03/17/20 07/26/20  Leafy Ro, MD    Family History Family History  Problem Relation Age of Onset  . Healthy Mother   . Healthy Father     Social History Social History   Tobacco Use  . Smoking status: Never Smoker  . Smokeless tobacco: Never Used  Vaping Use  . Vaping Use: Never used  Substance Use Topics  . Alcohol use: Not Currently  . Drug use: Never     Allergies   Aspirin and Penicillin g   Review of Systems Review of Systems   Physical Exam Triage Vital Signs ED Triage Vitals  Enc Vitals Group     BP 01/04/21 1211 113/78     Pulse Rate 01/04/21 1211 75     Resp  01/04/21 1211 18     Temp 01/04/21 1211 98.7 F (37.1 C)     Temp Source 01/04/21 1211 Oral     SpO2 01/04/21 1211 97 %     Weight 01/04/21 1213 155 lb (70.3 kg)     Height --      Head Circumference --      Peak Flow --      Pain Score 01/04/21 1211 8     Pain Loc --      Pain Edu? --      Excl. in GC? --    No data found.  Updated Vital Signs BP 113/78 (BP Location: Left Arm)   Pulse 75   Temp 98.7 F (37.1 C) (Oral)   Resp 18   Wt 155 lb (70.3 kg)   LMP 12/21/2020   SpO2 97%   BMI 26.61 kg/m   Visual Acuity Right Eye Distance:   Left Eye Distance:   Bilateral Distance:    Right Eye Near:   Left Eye Near:    Bilateral Near:     Physical Exam Vitals and nursing note reviewed.  Constitutional:      General: She is not in acute distress.  Appearance: Normal appearance. She is not ill-appearing, toxic-appearing or diaphoretic.  HENT:     Head: Normocephalic.     Right Ear: Tympanic membrane and ear canal normal. Tympanic membrane is not erythematous.     Left Ear: Tympanic membrane and ear canal normal.     Nose: Congestion present.     Mouth/Throat:     Pharynx: Oropharynx is clear.  Eyes:     Conjunctiva/sclera: Conjunctivae normal.      Comments: Subconjunctival hemorrhage.   Pulmonary:     Effort: Pulmonary effort is normal.     Breath sounds: Normal breath sounds.  Musculoskeletal:        General: Normal range of motion.     Cervical back: Normal range of motion.  Skin:    General: Skin is warm and dry.     Findings: No rash.  Neurological:     Mental Status: She is alert.  Psychiatric:        Mood and Affect: Mood normal.      UC Treatments / Results  Labs (all labs ordered are listed, but only abnormal results are displayed) Labs Reviewed - No data to display  EKG   Radiology No results found.  Procedures Procedures (including critical care time)  Medications Ordered in UC Medications - No data to display  Initial  Impression / Assessment and Plan / UC Course  I have reviewed the triage vital signs and the nursing notes.  Pertinent labs & imaging results that were available during my care of the patient were reviewed by me and considered in my medical decision making (see chart for details).     Sinusitis 1 week of symptoms and worsening.  Hx of recurrent sinus infections Treating with doxycycline today based on PCN allergy.  OTC as needed.  Final Clinical Impressions(s) / UC Diagnoses   Final diagnoses:  Acute non-recurrent frontal sinusitis     Discharge Instructions     Treating you for a sinus infection Take the medicines as prescribed OTC medicines as needed.     ED Prescriptions    Medication Sig Dispense Auth. Provider   doxycycline (VIBRAMYCIN) 100 MG capsule Take 1 capsule (100 mg total) by mouth 2 (two) times daily for 7 days. 14 capsule Adalay Azucena A, NP     PDMP not reviewed this encounter.   Janace Aris, NP 01/04/21 1550

## 2021-01-05 ENCOUNTER — Telehealth: Payer: Self-pay

## 2021-01-05 ENCOUNTER — Telehealth: Payer: Self-pay | Admitting: Family Medicine

## 2021-01-05 MED ORDER — AMOXICILLIN 500 MG PO CAPS
1000.0000 mg | ORAL_CAPSULE | Freq: Three times a day (TID) | ORAL | 0 refills | Status: AC
Start: 2021-01-05 — End: 2021-01-10

## 2021-01-05 MED ORDER — ONDANSETRON 4 MG PO TBDP
4.0000 mg | ORAL_TABLET | Freq: Three times a day (TID) | ORAL | 0 refills | Status: DC | PRN
Start: 2021-01-05 — End: 2023-06-26

## 2021-01-05 NOTE — Telephone Encounter (Signed)
Pt called stating that she vomited approx 30 minutes after taking doxycycline last night (on an empty stomach as directed), took morning dose with food and vomited approx 30 minutes later.  Pt states she thinks she has taken amoxicillin in past w/o complication. Despina Arias NP sent Rx of amoxil and zofran to pharmacy. Instructed pt to take Zofran approx 30 min prior to dose of ABX, and if pt develops rash with amoxil to inform [facility]/provider. Pt verbalized understanding.

## 2021-01-05 NOTE — Telephone Encounter (Signed)
Switching abx due to intolerance of doxy

## 2021-05-23 ENCOUNTER — Other Ambulatory Visit: Payer: Self-pay

## 2021-05-23 MED ORDER — CHOLESTYRAMINE 4 G PO PACK: 4.0000 g | PACK | Freq: Three times a day (TID) | ORAL | 1 refills | Status: DC

## 2021-11-29 ENCOUNTER — Ambulatory Visit (INDEPENDENT_AMBULATORY_CARE_PROVIDER_SITE_OTHER): Payer: 59

## 2021-11-29 ENCOUNTER — Ambulatory Visit
Admission: EM | Admit: 2021-11-29 | Discharge: 2021-11-29 | Disposition: A | Discharge status: Home / Self Care | Payer: 59 | Attending: Emergency Medicine | Admitting: Emergency Medicine

## 2021-11-29 DIAGNOSIS — S39012A Strain of muscle, fascia and tendon of lower back, initial encounter: Secondary | ICD-10-CM | POA: Diagnosis not present

## 2021-11-29 DIAGNOSIS — M545 Low back pain, unspecified: Secondary | ICD-10-CM | POA: Diagnosis not present

## 2021-11-29 DIAGNOSIS — M5431 Sciatica, right side: Secondary | ICD-10-CM | POA: Diagnosis not present

## 2021-11-29 MED ORDER — KETOROLAC TROMETHAMINE 60 MG/2ML IM SOLN
60.0000 mg | Freq: Once | INTRAMUSCULAR | Status: AC
  Administered 2021-11-29: 60 mg via INTRAMUSCULAR

## 2021-11-29 MED ORDER — TRAMADOL HCL 50 MG PO TABS: 50.0000 mg | ORAL_TABLET | Freq: Four times a day (QID) | ORAL | 0 refills | Status: DC | PRN

## 2021-11-29 MED ORDER — METHYLPREDNISOLONE 4 MG PO TBPK: ORAL_TABLET | ORAL | 0 refills | Status: DC

## 2021-11-29 NOTE — ED Provider Notes (Signed)
[facility]    CSN: [id number] Arrival date & time: 11/29/21  1443      History   Chief Complaint Chief Complaint  Patient presents with   Back Pain    HPI Carrie Webb [address] Carrie Webb is a 41 y.o. female who presents with central lower back pain radiating to her R thigh since she bent over today. Had injured her back around xmas and saw someone at an urgent care who placed her on Muscle relaxers and did not help. She has also tried Ibuprofen, heat, and stretches without relief.  When she injured her back in December it did not radiate to her leg. Pain is provoked bending over or twisting.    History reviewed. No pertinent past medical history.  Patient Active Problem List   Diagnosis Date Noted   Acute cholecystitis 03/12/2020    Past Surgical History:  Procedure Laterality Date   CESAREAN SECTION     CHOLECYSTECTOMY     KIDNEY SURGERY      OB History   No obstetric history on file.      Home Medications    Prior to Admission medications   Medication Sig Start Date End Date Taking? Authorizing Provider  methylPREDNISolone (MEDROL DOSEPAK) 4 MG TBPK tablet Take as directed 11/29/21  Yes Rodriguez-Southworth, Nettie Elm, PA-C  traMADol (ULTRAM) 50 MG tablet Take 1 tablet (50 mg total) by mouth every 6 (six) hours as needed. 11/29/21  Yes Rodriguez-Southworth, Nettie Elm, PA-C  cholestyramine (QUESTRAN) 4 g packet Take 1 packet (4 g total) by mouth 3 (three) times daily. 05/23/21   Pabon, Diego F, MD  ondansetron (ZOFRAN ODT) 4 MG disintegrating tablet Take 1 tablet (4 mg total) by mouth every 8 (eight) hours as needed for nausea or vomiting. 01/05/21   Dahlia Byes A, NP  gabapentin (NEURONTIN) 300 MG capsule Take 1 capsule (300 mg total) by mouth 3 (three) times daily. 03/17/20 07/26/20  Leafy Ro, MD    Family History Family History  Problem Relation Age of Onset   Healthy Mother    Healthy Father     Social History Social History   Tobacco Use   Smoking status: Never    Smokeless tobacco: Never  Vaping Use   Vaping Use: Never used  Substance Use Topics   Alcohol use: Yes    Comment: social drinker   Drug use: Never     Allergies   Aspirin, Penicillin g, and Penicillins   Review of Systems Review of Systems  Constitutional:  Negative for fever.  Gastrointestinal:  Negative for abdominal pain.  Genitourinary:  Negative for difficulty urinating.  Musculoskeletal:  Positive for back pain. Negative for gait problem.  Skin:  Negative for rash and wound.  Neurological:  Negative for weakness and numbness.    Physical Exam Triage Vital Signs ED Triage Vitals  Enc Vitals Group     BP 11/29/21 1614 115/75     Pulse Rate 11/29/21 1614 92     Resp 11/29/21 1614 16     Temp 11/29/21 1614 98.5 F (36.9 C)     Temp Source 11/29/21 1614 Oral     SpO2 11/29/21 1614 98 %     Weight --      Height --      Head Circumference --      Peak Flow --      Pain Score 11/29/21 1610 8     Pain Loc --      Pain Edu? --  Excl. in GC? --    No data found.  Updated Vital Signs BP 115/75 (BP Location: Right Arm)    Pulse 92    Temp 98.5 F (36.9 C) (Oral)    Resp 16    LMP 11/09/2021 (Approximate) Comment: approx 3 weeks ago, pt to sign preg form   SpO2 98%   Visual Acuity Right Eye Distance:   Left Eye Distance:   Bilateral Distance:    Right Eye Near:   Left Eye Near:    Bilateral Near:     Physical Exam Constitutional:      General: She is not in acute distress.    Appearance: She is not toxic-appearing.  HENT:     Right Ear: External ear normal.     Left Ear: External ear normal.  Eyes:     General: No scleral icterus.    Conjunctiva/sclera: Conjunctivae normal.  Pulmonary:     Effort: Pulmonary effort is normal.  Musculoskeletal:     Cervical back: Neck supple.     Comments: BACK- has local tenderness on mid lumbar spine region. Neg SLR ROM is limited due to pain.   Lymphadenopathy:     Cervical: No cervical adenopathy.   Skin:    General: Skin is warm and dry.     Findings: No bruising, erythema or rash.  Neurological:     Mental Status: She is alert and oriented to person, place, and time.     Gait: Gait normal.     Deep Tendon Reflexes: Reflexes normal.  Psychiatric:        Mood and Affect: Mood normal.        Behavior: Behavior normal.        Thought Content: Thought content normal.        Judgment: Judgment normal.     UC Treatments / Results  Labs (all labs ordered are listed, but only abnormal results are displayed) Labs Reviewed - No data to display  EKG   Radiology DG Lumbar Spine Complete  Result Date: 11/29/2021 CLINICAL DATA:  Lumbar spine pain for 6 weeks. EXAM: LUMBAR SPINE - COMPLETE 4+ VIEW COMPARISON:  None. 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 The first non rib-bearing vertebral body may have a segmented right transverse process and is considered T12. Distal to this the next 5 vertebral bodies are considered L1 through L5 with the L4 vertebral body predominantly above the iliac crest on lateral view. There is partial sacralization of L5. Normal sagittal alignment. Vertebral body heights are maintained. Mild L5-S1 disc space narrowing. Mild anterior L4-5 endplate spurring. The bilateral sacroiliac joint spaces are maintained. Vascular phleboliths overlie the pelvis. IMPRESSION:: IMPRESSION: 1. Transitional lumbosacral anatomy as described above. 2. Mild L5-S1 disc space narrowing. Electronically Signed   By: Neita Garnet M.D.   On: 11/29/2021 17:21    Procedures Procedures (including critical care time)  Medications Ordered in UC Medications  ketorolac (TORADOL) injection 60 mg (60 mg Intramuscular Given 11/29/21 1654)    Initial Impression / Assessment and Plan / UC Course  I have reviewed the triage vital signs and the nursing notes. Pertinent  imaging results that were available during my care of the patient were reviewed by me and considered in my medical  decision making (see chart for details). Lumbar strain with sciatica I placed her on Medrol dose pack, and Tramadol. Needs to FU with ortho as noted above See instructions.     Final Clinical Impressions(s) / UC Diagnoses   Final  diagnoses:  Sciatica of right side  Strain of lumbar region, initial encounter     Discharge Instructions      Apply ice on area of pain for 20 minutes 3-4 times a day today and tomorrow, then after that you can alternate with heat.   Follow up with Orthopedic doctor as noted above in the next week.  In the mean time if you have insurance where you dont nee a PCP to referred you, call physical therapy to help you heal [facility] Physical Therapy- [facility] [address] [place],  [place]  [address] Main: [phone]     ED Prescriptions     Medication Sig Dispense Auth. Provider   traMADol (ULTRAM) 50 MG tablet Take 1 tablet (50 mg total) by mouth every 6 (six) hours as needed. 15 tablet Rodriguez-Southworth, Gotham Raden, PA-C   methylPREDNISolone (MEDROL DOSEPAK) 4 MG TBPK tablet Take as directed 21 tablet Rodriguez-Southworth, Nettie Elm, PA-C      I have reviewed the PDMP during this encounter.   Garey Ham, [place] 11/29/21 1903

## 2021-11-29 NOTE — ED Triage Notes (Signed)
Patient presents to Urgent Care with complaints of lower mid back pain since after christmas. Pt states she bent over and since has had back pain. She was treated at another urgent care was prescribed muscle relaxants with no relief, has been applying heat, ibuprofen, and stretching with no improvements. She states she bent over today and caused a flare-up. She states pain starts at the lower mid back that shoots down to right leg.

## 2021-11-29 NOTE — Discharge Instructions (Addendum)
Apply ice on area of pain for 20 minutes 3-4 times a day today and tomorrow, then after that you can alternate with heat.   Follow up with Orthopedic doctor as noted above in the next week.  In the mean time if you have insurance where you dont nee a PCP to referred you, call physical therapy to help you heal [facility] Physical Therapy- [facility] [address] [place] [place],  [place]  [address] Main: [phone]

## 2022-03-06 ENCOUNTER — Ambulatory Visit
Admission: RE | Admit: 2022-03-06 | Discharge: 2022-03-06 | Disposition: A | Discharge status: Home / Self Care | Payer: 59 | Source: Ambulatory Visit

## 2022-03-06 ENCOUNTER — Other Ambulatory Visit: Payer: Self-pay

## 2022-03-06 VITALS — BP 118/82 | HR 87 | Temp 98.1°F | Resp 18

## 2022-03-06 DIAGNOSIS — J029 Acute pharyngitis, unspecified: Secondary | ICD-10-CM

## 2022-03-06 LAB — POCT RAPID STREP A (OFFICE): Rapid Strep A Screen: NEGATIVE

## 2022-03-06 NOTE — Discharge Instructions (Addendum)
Your strep test is negative.  Take Tylenol or ibuprofen as needed.  Follow up with your primary care provider if your symptoms are not improving.   ° ° °

## 2022-03-06 NOTE — ED Triage Notes (Signed)
Patient c/o sore throat x 1 day.  ? ?Patient endorses cervical lymph node tenderness.  ? ?Patient endorses fever at home. Patient endorses a temperature of 101 F.  ? 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  ? ?Patient has taken Tylenol and Throat Spray with some relief of symptoms.  ?

## 2022-03-06 NOTE — ED Provider Notes (Signed)
?[facility] ? ? ? ?CSN: [id number] ?Arrival date & time: 03/06/22  1129 ? ? ?  ? ?History   ?Chief Complaint ?Chief Complaint  ?Patient presents with  ? Sore Throat  ? APPT 1200  ? ? ?HPI ?Carrie Webb is a 41 y.o. female.  Patient presents with 1 day history of fever and sore throat.  Tmax 101.  No rash, cough, shortness of breath, vomiting, diarrhea, or other symptoms.  Treatment at home with Tylenol and throat spray.  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 ? ?The history is provided by the patient and medical records.  ? ?History reviewed. No pertinent past medical history. ? ?Patient Active Problem List  ? Diagnosis Date Noted  ? Acute cholecystitis 03/12/2020  ? ? ?Past Surgical History:  ?Procedure Laterality Date  ? CESAREAN SECTION    ? CHOLECYSTECTOMY    ? KIDNEY SURGERY    ? ? ?OB History   ?No obstetric history on file. ?  ? ? ? ?Home Medications   ? ?Prior to Admission medications   ?Medication Sig Start Date End Date Taking? Authorizing Provider  ?Multiple Vitamin (MULTIVITAMIN ADULT PO) Take by mouth.   Yes [provider]  ?cholestyramine (QUESTRAN) 4 g packet Take 1 packet (4 g total) by mouth 3 (three) times daily. 05/23/21   Pabon, Merri Ray, MD  ?methylPREDNISolone (MEDROL DOSEPAK) 4 MG TBPK tablet Take as directed 11/29/21   Rodriguez-Southworth, Nettie Elm, PA-C  ?ondansetron (ZOFRAN ODT) 4 MG disintegrating tablet Take 1 tablet (4 mg total) by mouth every 8 (eight) hours as needed for nausea or vomiting. 01/05/21   Dahlia Byes A, NP  ?traMADol (ULTRAM) 50 MG tablet Take 1 tablet (50 mg total) by mouth every 6 (six) hours as needed. 11/29/21   Rodriguez-Southworth, Nettie Elm, PA-C  ?gabapentin (NEURONTIN) 300 MG capsule Take 1 capsule (300 mg total) by mouth 3 (three) times daily. 03/17/20 07/26/20  Leafy Ro, MD  ? ? ?Family History ?Family History  ?Problem Relation Age of Onset  ? Healthy Mother   ? Healthy Father   ? ? ?Social  History ?Social History  ? ?Tobacco Use  ? Smoking status: Never  ? Smokeless tobacco: Never  ?Vaping Use  ? Vaping Use: Never used  ?Substance Use Topics  ? Alcohol use: Yes  ?  Comment: social drinker  ? Drug use: Never  ? ? ? ?Allergies   ?Aspirin, Penicillin g, and Penicillins ? ? ?Review of Systems ?Review of Systems  ?Constitutional:  Positive for fever. Negative for chills.  ?HENT:  Positive for sore throat. Negative for ear pain.   ?Respiratory:  Negative for cough and shortness of breath.   ?Cardiovascular:  Negative for chest pain and palpitations.  ?Gastrointestinal:  Negative for diarrhea and vomiting.  ?Skin:  Negative for color change and rash.  ?All other systems reviewed and are negative. ? ? ?Physical Exam ?Triage Vital Signs ?ED Triage Vitals [03/06/22 1135]  ?Enc Vitals Group  ?   BP 118/82  ?   Pulse Rate 87  ?   Resp 18  ?   Temp 98.1 ?F (36.7 ?C)  ?   Temp src   ?   SpO2 99 %  ?   Weight   ?   Height   ?   Head Circumference   ?   Peak Flow   ?   Pain Score   ?   Pain Loc   ?  Pain Edu?   ?   Excl. in GC?   ? ?No data found. ? ?Updated Vital Signs ?BP 118/82   Pulse 87   Temp 98.1 ?F (36.7 ?C)   Resp 18   LMP 02/17/2022 (Exact Date)   SpO2 99%  ? ?Visual Acuity ?Right Eye Distance:   ?Left Eye Distance:   ?Bilateral Distance:   ? ?Right Eye Near:   ?Left Eye Near:    ?Bilateral Near:    ? ?Physical Exam ?Vitals and nursing note reviewed.  ?Constitutional:   ?   General: She is not in acute distress. ?   Appearance: Normal appearance. She is well-developed. She is not ill-appearing.  ?HENT:  ?   Right Ear: Tympanic membrane normal.  ?   Left Ear: Tympanic membrane normal.  ?   Nose: Nose normal.  ?   Mouth/Throat:  ?   Mouth: Mucous membranes are moist.  ?   Pharynx: Posterior oropharyngeal erythema present.  ?Cardiovascular:  ?   Rate and Rhythm: Normal rate and regular rhythm.  ?   Heart sounds: Normal heart sounds.  ?Pulmonary:  ?   Effort: Pulmonary effort is normal. No respiratory  distress.  ?   Breath sounds: Normal breath sounds.  ?Musculoskeletal:  ?   Cervical back: Neck supple.  ?Skin: ?   General: Skin is warm and dry.  ?Neurological:  ?   Mental Status: She is alert.  ?Psychiatric:     ?   Mood and Affect: Mood normal.     ?   Behavior: Behavior normal.  ? ? ? ?UC Treatments / Results  ?Labs ?(all labs ordered are listed, but only abnormal results are displayed) ?Labs Reviewed  ?POCT RAPID STREP A (OFFICE)  ? ? ?EKG ? ? ?Radiology ?No results found. ? ?Procedures ?Procedures (including critical care time) ? ?Medications Ordered in UC ?Medications - No data to display ? ?Initial Impression / Assessment and Plan / UC Course  ?I have reviewed the triage vital signs and the nursing notes. ? ?Pertinent labs & imaging results that were available during my care of the patient were reviewed by me and considered in my medical decision making (see chart for details). ? ?  ?Viral pharyngitis.  Rapid strep negative.  Discussed symptomatic treatment including Tylenol or ibuprofen as needed.    Education provided on sore throat.  Instructed patient to follow up with her PCP if her symptoms are not improving.  She agrees to plan of care.  ? ? ?Final Clinical Impressions(s) / UC Diagnoses  ? ?Final diagnoses:  ?Viral pharyngitis  ? ? ? ?Discharge Instructions   ? ?  ?Your strep test is negative.   ? ?Take Tylenol or ibuprofen as needed.  Follow up with your primary care provider if your symptoms are not improving.   ? ? ? ? ? ?ED Prescriptions   ?None ?  ? ?PDMP not reviewed this encounter. ?  ?Mickie Bail, NP ?03/06/22 1209 ? ?

## 2023-06-26 ENCOUNTER — Emergency Department: Payer: 59

## 2023-06-26 ENCOUNTER — Ambulatory Visit
Admission: EM | Admit: 2023-06-26 | Discharge: 2023-06-26 | Payer: 59 | Attending: Physician Assistant | Admitting: Physician Assistant

## 2023-06-26 ENCOUNTER — Encounter: Payer: Self-pay | Admitting: Emergency Medicine

## 2023-06-26 ENCOUNTER — Other Ambulatory Visit: Payer: Self-pay

## 2023-06-26 ENCOUNTER — Inpatient Hospital Stay
Admission: EM | Admit: 2023-06-26 | Discharge: 2023-06-29 | DRG: 759 | Disposition: A | Discharge status: Home / Self Care | Payer: 59 | Attending: Obstetrics and Gynecology | Admitting: Obstetrics and Gynecology

## 2023-06-26 DIAGNOSIS — Z79899 Other long term (current) drug therapy: Secondary | ICD-10-CM | POA: Diagnosis not present

## 2023-06-26 DIAGNOSIS — Z880 Allergy status to penicillin: Secondary | ICD-10-CM

## 2023-06-26 DIAGNOSIS — Z9049 Acquired absence of other specified parts of digestive tract: Secondary | ICD-10-CM | POA: Diagnosis not present

## 2023-06-26 DIAGNOSIS — R198 Other specified symptoms and signs involving the digestive system and abdomen: Secondary | ICD-10-CM | POA: Insufficient documentation

## 2023-06-26 DIAGNOSIS — B9689 Other specified bacterial agents as the cause of diseases classified elsewhere: Secondary | ICD-10-CM

## 2023-06-26 DIAGNOSIS — N760 Acute vaginitis: Secondary | ICD-10-CM

## 2023-06-26 DIAGNOSIS — Z886 Allergy status to analgesic agent status: Secondary | ICD-10-CM | POA: Diagnosis not present

## 2023-06-26 DIAGNOSIS — R112 Nausea with vomiting, unspecified: Secondary | ICD-10-CM | POA: Insufficient documentation

## 2023-06-26 DIAGNOSIS — R1031 Right lower quadrant pain: Secondary | ICD-10-CM | POA: Diagnosis present

## 2023-06-26 DIAGNOSIS — N7093 Salpingitis and oophoritis, unspecified: Secondary | ICD-10-CM | POA: Diagnosis present

## 2023-06-26 LAB — URINALYSIS, W/ REFLEX TO CULTURE (INFECTION SUSPECTED)
Bilirubin Urine: NEGATIVE
Glucose, UA: NEGATIVE mg/dL
Ketones, ur: NEGATIVE mg/dL
Nitrite: POSITIVE — AB
Protein, ur: 30 mg/dL — AB
Specific Gravity, Urine: >1.03 — ABNORMAL HIGH (ref 1.005–1.030)
pH: 5.5 (ref 5.0–8.0)

## 2023-06-26 LAB — CBC
HCT: 41.8 % (ref 36.0–46.0)
Hemoglobin: 14.1 g/dL (ref 12.0–15.0)
MCH: 30.1 pg (ref 26.0–34.0)
MCHC: 33.7 g/dL (ref 30.0–36.0)
MCV: 89.1 fL (ref 80.0–100.0)
Platelets: 276 10*3/uL (ref 150–400)
RBC: 4.69 MIL/uL (ref 3.87–5.11)
RDW: 12.8 % (ref 11.5–15.5)
WBC: 18 10*3/uL — ABNORMAL HIGH (ref 4.0–10.5)
nRBC: 0 % (ref 0.0–0.2)

## 2023-06-26 LAB — COMPREHENSIVE METABOLIC PANEL
ALT: 13 U/L (ref 0–44)
AST: 16 U/L (ref 15–41)
Albumin: 4 g/dL (ref 3.5–5.0)
Alkaline Phosphatase: 72 U/L (ref 38–126)
Anion gap: 12 (ref 5–15)
BUN: 8 mg/dL (ref 6–20)
CO2: 23 mmol/L (ref 22–32)
Calcium: 9.2 mg/dL (ref 8.9–10.3)
Chloride: 100 mmol/L (ref 98–111)
Creatinine, Ser: 0.92 mg/dL (ref 0.44–1.00)
GFR, Estimated: >60 mL/min (ref >60)
Glucose, Bld: 115 mg/dL — ABNORMAL HIGH (ref 70–99)
Potassium: 3.8 mmol/L (ref 3.5–5.1)
Sodium: 135 mmol/L (ref 135–145)
Total Bilirubin: 0.8 mg/dL (ref 0.3–1.2)
Total Protein: 8.5 g/dL — ABNORMAL HIGH (ref 6.5–8.1)

## 2023-06-26 LAB — LIPASE, BLOOD: Lipase: 27 U/L (ref 11–51)

## 2023-06-26 LAB — WET PREP, GENITAL
Sperm: NONE SEEN
Trich, Wet Prep: NONE SEEN
WBC, Wet Prep HPF POC: >=10 — AB (ref <10)
Yeast Wet Prep HPF POC: NONE SEEN

## 2023-06-26 LAB — CHLAMYDIA/NGC RT PCR (ARMC ONLY)
Chlamydia Tr: NOT DETECTED
N gonorrhoeae: NOT DETECTED

## 2023-06-26 LAB — POC URINE PREG, ED: Preg Test, Ur: NEGATIVE

## 2023-06-26 MED ORDER — PRENATAL MULTIVITAMIN CH: 1.0000 | ORAL_TABLET | Freq: Every day | ORAL | Status: DC

## 2023-06-26 MED ORDER — LACTATED RINGERS IV SOLN: INTRAVENOUS | Status: DC

## 2023-06-26 MED ORDER — METRONIDAZOLE 500 MG/100ML IV SOLN
500.0000 mg | Freq: Three times a day (TID) | INTRAVENOUS | Status: AC
  Administered 2023-06-26 – 2023-06-28 (×6): 500 mg via INTRAVENOUS
  Filled 2023-06-26 (×5): qty 100

## 2023-06-26 MED ORDER — METRONIDAZOLE 500 MG/100ML IV SOLN
500.0000 mg | Freq: Once | INTRAVENOUS | Status: DC
  Filled 2023-06-26: qty 100

## 2023-06-26 MED ORDER — MORPHINE SULFATE (PF) 4 MG/ML IV SOLN
4.0000 mg | Freq: Once | INTRAVENOUS | Status: AC
  Administered 2023-06-26: 4 mg via INTRAVENOUS
  Filled 2023-06-26: qty 1

## 2023-06-26 MED ORDER — ONDANSETRON HCL 4 MG/2ML IJ SOLN
4.0000 mg | Freq: Four times a day (QID) | INTRAMUSCULAR | Status: DC | PRN
  Administered 2023-06-26 – 2023-06-27 (×2): 4 mg via INTRAVENOUS
  Filled 2023-06-26 (×3): qty 2

## 2023-06-26 MED ORDER — IOHEXOL 300 MG/ML  SOLN
100.0000 mL | Freq: Once | INTRAMUSCULAR | Status: AC | PRN
  Administered 2023-06-26: 100 mL via INTRAVENOUS

## 2023-06-26 MED ORDER — ACETAMINOPHEN 500 MG PO TABS
1000.0000 mg | ORAL_TABLET | Freq: Three times a day (TID) | ORAL | Status: DC | PRN
  Administered 2023-06-26 – 2023-06-28 (×3): 1000 mg via ORAL
  Filled 2023-06-26 (×3): qty 2

## 2023-06-26 MED ORDER — MORPHINE SULFATE (PF) 2 MG/ML IV SOLN
2.0000 mg | INTRAVENOUS | Status: DC | PRN
  Administered 2023-06-26 – 2023-06-27 (×8): 2 mg via INTRAVENOUS
  Filled 2023-06-26 (×8): qty 1

## 2023-06-26 MED ORDER — ONDANSETRON HCL 4 MG/2ML IJ SOLN
4.0000 mg | Freq: Once | INTRAMUSCULAR | Status: AC
  Administered 2023-06-26: 4 mg via INTRAVENOUS
  Filled 2023-06-26: qty 2

## 2023-06-26 MED ORDER — SODIUM CHLORIDE 0.9 % IV SOLN
100.0000 mg | Freq: Once | INTRAVENOUS | Status: AC
  Administered 2023-06-26: 100 mg via INTRAVENOUS
  Filled 2023-06-26: qty 100

## 2023-06-26 MED ORDER — SODIUM CHLORIDE 0.9 % IV SOLN
2.0000 g | Freq: Four times a day (QID) | INTRAVENOUS | Status: DC
  Administered 2023-06-26 – 2023-06-29 (×11): 2 g via INTRAVENOUS
  Filled 2023-06-26 (×13): qty 2

## 2023-06-26 MED ORDER — SODIUM CHLORIDE 0.9 % IV SOLN
1.0000 g | INTRAVENOUS | Status: DC
  Administered 2023-06-26: 1 g via INTRAVENOUS
  Filled 2023-06-26: qty 10

## 2023-06-26 MED ORDER — SODIUM CHLORIDE 0.9 % IV SOLN
2.0000 g | Freq: Once | INTRAVENOUS | Status: DC
  Filled 2023-06-26: qty 2

## 2023-06-26 MED ORDER — SODIUM CHLORIDE 0.9 % IV SOLN
100.0000 mg | Freq: Two times a day (BID) | INTRAVENOUS | Status: DC
  Administered 2023-06-27 – 2023-06-29 (×5): 100 mg via INTRAVENOUS
  Filled 2023-06-26 (×6): qty 100

## 2023-06-26 NOTE — ED Provider Notes (Signed)
[facility]    CSN: [id number] Arrival date & time: 06/26/23  7829      History   Chief Complaint Chief Complaint  Patient presents with   Groin Pain   Emesis    HPI Carrie Webb [address] Carrie Webb is a 42 y.o. female.   Patient presents today with a 3 to 4-day history of worsening right lower quadrant abdominal pain.  She reports that pain is rated 8 on a 0-10 pain scale but increases to a 10 at different times, worse with palpation or certain movements, no alleviating factors identified.  She denies any urinary symptoms including frequency, urgency, hematuria.  She has had several episodes of nausea and vomiting with approximately 6 episodes of emesis in the past 24 hours.  Denies any associated hematemesis.  Denies any medication changes, antibiotic use, recent travel.  She initially thought symptoms were related to constipation/gas and took a laxative that was ineffective in managing her pain.  She did have a small bowel movement with her last movement yesterday that she describes as normal.  She does report cholecystectomy in 2021 and has had C-section but denies additional abdominal surgeries including appendectomy.  She is having difficulty with daily activities as a result of symptoms.    History reviewed. No pertinent past medical history.  Patient Active Problem List   Diagnosis Date Noted   Acute cholecystitis 03/12/2020    Past Surgical History:  Procedure Laterality Date   CESAREAN SECTION     CHOLECYSTECTOMY     KIDNEY SURGERY      OB History   No obstetric history on file.      Home Medications    Prior to Admission medications   Medication Sig Start Date End Date Taking? Authorizing Provider  gabapentin (NEURONTIN) 300 MG capsule Take 1 capsule (300 mg total) by mouth 3 (three) times daily. 03/17/20 07/26/20  Leafy Ro, MD    Family History Family History  Problem Relation Age of Onset   Healthy Mother    Healthy Father     Social  History Social History   Tobacco Use   Smoking status: Never   Smokeless tobacco: Never  Vaping Use   Vaping status: Never Used  Substance Use Topics   Alcohol use: Yes    Comment: social drinker   Drug use: Never     Allergies   Penicillins, Aspirin, and Penicillin g   Review of Systems Review of Systems  Constitutional:  Positive for activity change and appetite change. Negative for fatigue and fever.  Respiratory:  Negative for cough and shortness of breath.   Cardiovascular:  Negative for chest pain.  Gastrointestinal:  Positive for abdominal pain, nausea and vomiting. Negative for blood in stool, constipation and diarrhea.  Musculoskeletal:  Negative for arthralgias and myalgias.  Neurological:  Negative for dizziness, light-headedness and headaches.     Physical Exam Triage Vital Signs ED Triage Vitals  Encounter Vitals Group     BP 06/26/23 0929 105/88     Systolic BP Percentile --      Diastolic BP Percentile --      Pulse Rate 06/26/23 0929 93     Resp 06/26/23 0929 16     Temp 06/26/23 0929 98.2 F (36.8 C)     Temp Source 06/26/23 0929 Oral     SpO2 06/26/23 0929 97 %     Weight 06/26/23 0928 160 lb (72.6 kg)     Height 06/26/23 0928 5\' 4"  (1.626 m)  Head Circumference --      Peak Flow --      Pain Score 06/26/23 0932 7     Pain Loc --      Pain Education --      Exclude from Growth Chart --    No data found.  Updated Vital Signs BP 105/88 (BP Location: Left Arm)   Pulse 93   Temp 98.2 F (36.8 C) (Oral)   Resp 16   Ht 5\' 4"  (1.626 m)   Wt 160 lb (72.6 kg)   SpO2 97%   BMI 27.46 kg/m   Visual Acuity Right Eye Distance:   Left Eye Distance:   Bilateral Distance:    Right Eye Near:   Left Eye Near:    Bilateral Near:     Physical Exam Vitals reviewed.  Constitutional:      General: She is awake. She is not in acute distress.    Appearance: Normal appearance. She is well-developed. She is ill-appearing.     Comments: Very  pleasant female appears stated age in no acute distress sitting comfortably in exam room holding her abdomen  HENT:     Head: Normocephalic and atraumatic.  Cardiovascular:     Rate and Rhythm: Normal rate and regular rhythm.     Heart sounds: Normal heart sounds, S1 normal and S2 normal. No murmur heard. Pulmonary:     Effort: Pulmonary effort is normal.     Breath sounds: Normal breath sounds. No wheezing, rhonchi or rales.     Comments: Clear to auscultation bilaterally Abdominal:     General: Bowel sounds are normal.     Palpations: Abdomen is soft.     Tenderness: There is abdominal tenderness in the right lower quadrant. There is right CVA tenderness, guarding and rebound. There is no left CVA tenderness. Positive signs include Rovsing's sign, McBurney's sign and psoas sign. Negative signs include Murphy's sign.  Psychiatric:        Behavior: Behavior is cooperative.      UC Treatments / Results  Labs (all labs ordered are listed, but only abnormal results are displayed) Labs Reviewed  URINALYSIS, W/ REFLEX TO CULTURE (INFECTION SUSPECTED)    EKG   Radiology No results found.  Procedures Procedures (including critical care time)  Medications Ordered in UC Medications - No data to display  Initial Impression / Assessment and Plan / UC Course  I have reviewed the triage vital signs and the nursing notes.  Pertinent labs & imaging results that were available during my care of the patient were reviewed by me and considered in my medical decision making (see chart for details).     Concern for acute appendicitis given physical exam findings clinical presentation.  Unfortunately, we do not have CT with contrast in clinic concerned recommend that she go to the emergency room for further evaluation management.  She is agreeable to this and will have her mother drive her directly to [facility] for further evaluation and management.  She was stable at the time of discharge and  safe for private transport.  Final Clinical Impressions(s) / UC Diagnoses   Final diagnoses:  RLQ abdominal pain  Abdominal guarding  Nausea and vomiting, unspecified vomiting type     Discharge Instructions      Please have your mother take you directly to the emergency room for further evaluation and management as I am concerned about your appendix.     ED Prescriptions   None    PDMP  not reviewed this encounter.   Jeani Hawking, PA-C 06/26/23 1610

## 2023-06-26 NOTE — ED Triage Notes (Signed)
Pt c/o R sided lower groin pain x4 days. States she was concerned about gas & constipation. Has tried laxative & gas pill w/o relief. Had 1 episode of emesis this AM.

## 2023-06-26 NOTE — ED Notes (Signed)
Patient is being discharged from the Urgent Care and sent to the Emergency Department via personal vehicle . Per Denny Peon Raspat, PA, patient is in need of higher level of care due to severe RLQ pain. Patient is aware and verbalizes understanding of plan of care.  Vitals:   06/26/23 0929  BP: 105/88  Pulse: 93  Resp: 16  Temp: 98.2 F (36.8 C)  SpO2: 97%

## 2023-06-26 NOTE — ED Notes (Signed)
Report called to 3rd floor.

## 2023-06-26 NOTE — ED Provider Notes (Signed)
[facility] Provider Note    Event Date/Time   First MD Initiated Contact with Patient 06/26/23 1042     (approximate)   History   Abdominal Pain   HPI  Carrie Webb [address] [name] is a 42 y.o. female with a past medical history of cholecystectomy and 2 cesarean sections who presents today for evaluation of right lower quadrant pain.  Patient reports that this began 3 days ago more central, and is since localized to her right lower quadrant.  She reports that she began vomiting last night and has no appetite over the last 2 days.  She denies burning with urination.  No fevers, reports that she felt very cold last night.  No blood in her stool.  She reports that she is sexually active with her husband only.  Last had intercourse 1 week ago.  She denies any vaginal discharge.  She has not had history of PID or sexually transmitted diseases in the past.  She reports that she had an abnormal Pap smear and required "cervical scraping" 20 years ago but has not had any problems since then.  Patient Active Problem List   Diagnosis Date Noted   Acute cholecystitis 03/12/2020          Physical Exam   Triage Vital Signs: ED Triage Vitals  Encounter Vitals Group     BP 06/26/23 1019 116/89     Systolic BP Percentile --      Diastolic BP Percentile --      Pulse Rate 06/26/23 1019 96     Resp 06/26/23 1019 18     Temp 06/26/23 1019 98.7 F (37.1 C)     Temp Source 06/26/23 1019 Oral     SpO2 06/26/23 1019 99 %     Weight 06/26/23 1016 160 lb (72.6 kg)     Height 06/26/23 1016 5\' 4"  (1.626 m)     Head Circumference --      Peak Flow --      Pain Score 06/26/23 1015 7     Pain Loc --      Pain Education --      Exclude from Growth Chart --     Most recent vital signs: Vitals:   06/26/23 1115 06/26/23 1430  BP: 107/67 101/76  Pulse: 98 94  Resp:  18  Temp:    SpO2: 92% 100%    Physical Exam Vitals and nursing note reviewed.  Constitutional:      General:  Awake and alert.  Appears uncomfortable    Appearance: Normal appearance. The patient is overweight.  HENT:     Head: Normocephalic and atraumatic.     Mouth: Mucous membranes are moist.  Eyes:     General: PERRL. Normal EOMs        Right eye: No discharge.        Left eye: No discharge.     Conjunctiva/sclera: Conjunctivae normal.  Cardiovascular:     Rate and Rhythm: Normal rate and regular rhythm.     Pulses: Normal pulses.  Pulmonary:     Effort: Pulmonary effort is normal. No respiratory distress.     Breath sounds: Normal breath sounds.  Abdominal:     Abdomen is soft. There is right lower quadrant and suprapubic abdominal tenderness.  Guarding present.   Musculoskeletal:        General: No swelling. Normal range of motion.     Cervical back: Normal range of motion and neck supple.  Skin:  General: Skin is warm and dry.     Capillary Refill: Capillary refill takes less than 2 seconds.     Findings: No rash.  Neurological:     Mental Status: The patient is awake and alert.      ED Results / Procedures / Treatments   Labs (all labs ordered are listed, but only abnormal results are displayed) Labs Reviewed  WET PREP, GENITAL - Abnormal; Notable for the following components:      Result Value   Clue Cells Wet Prep HPF POC PRESENT (*)    WBC, Wet Prep HPF POC >=10 (*)    All other components within normal limits  COMPREHENSIVE METABOLIC PANEL - Abnormal; Notable for the following components:   Glucose, Bld 115 (*)    Total Protein 8.5 (*)    All other components within normal limits  CBC - Abnormal; Notable for the following components:   WBC 18.0 (*)    All other components within normal limits  CHLAMYDIA/NGC RT PCR ([facility] ONLY)            LIPASE, BLOOD  POC URINE PREG, ED     EKG     RADIOLOGY I independently reviewed and interpreted imaging and agree with radiologists findings.     PROCEDURES:  Critical Care performed:    Procedures   MEDICATIONS ORDERED IN ED: Medications  cefTRIAXone (ROCEPHIN) 1 g in sodium chloride 0.9 % 100 mL IVPB (0 g Intravenous Stopped 06/26/23 1456)  doxycycline (VIBRAMYCIN) 100 mg in sodium chloride 0.9 % 250 mL IVPB (100 mg Intravenous New Bag/Given 06/26/23 1500)  cefOXitin (MEFOXIN) 2 g in sodium chloride 0.9 % 100 mL IVPB (has no administration in time range)  morphine (PF) 4 MG/ML injection 4 mg (4 mg Intravenous Given 06/26/23 1111)  ondansetron (ZOFRAN) injection 4 mg (4 mg Intravenous Given 06/26/23 1111)  iohexol (OMNIPAQUE) 300 MG/ML solution 100 mL (100 mLs Intravenous Contrast Given 06/26/23 1127)  morphine (PF) 4 MG/ML injection 4 mg (4 mg Intravenous Given 06/26/23 1339)     IMPRESSION / MDM / ASSESSMENT AND PLAN / ED COURSE  I reviewed the triage vital signs and the nursing notes.   Differential diagnosis includes, but is not limited to, appendicitis, UTI/pyelonephritis, nephrolithiasis abscess, perforation.  Patient is awake and alert, hemodynamically stable and afebrile.  She is quite tender in her right lower quadrant and appears to be uncomfortable.  She required morphine and Zofran upon arrival was given a subsequent dose later in her stay.  Labs obtained are revealing for leukocytosis to 18.  CT scan obtained due to original concern for appendicitis.  However, CT scan reveals a small right tubo-ovarian abscess with small pyosalpinx.  Upon further discussion with the patient, she reports that she has not been sexually active with any other partners for several years, only her husband.  She has not had any vaginal discharge or dyspareunia.  I paged Dr. Feliberto Gottron.  Also messaged nurse midwife on-call.  Discussed with pharmacy who reports that she has tolerated amoxicillin in the past, and feels that there is no concern for giving ceftriaxone.  Ceftriaxone, doxycycline, and Flagyl given after discussion with pharmacy.  Dr. Feliberto Gottron came to evaluate the patient at  the bedside.  He recommends admission to his service and the patient and the family are in agreement with this plan.  Dr. Feliberto Gottron is also requested cefoxitin to be given, knows that patient has just received ceftriaxone.   Patient's presentation is most consistent with acute  presentation with potential threat to life or bodily function.   Clinical Course as of 06/26/23 1511  Tue Jun 26, 2023  1321 Dr. Feliberto Gottron paged [JP]  1331 Also messaged Donato Schultz, CNM on call [JP]  1422 Dr Feliberto Gottron requesting [place] [JP]  1425 Likely OB admission for IV abx [JP]  1449 Patient has been seen by Dr. Feliberto Gottron who is requesting cefoxitin as well [JP]    Clinical Course User Index [JP] Leonard Hendler, Herb Grays, PA-C     FINAL CLINICAL IMPRESSION(S) / ED DIAGNOSES   Final diagnoses:  Tubo-ovarian abscess  BV (bacterial vaginosis)     Rx / DC Orders   ED Discharge Orders     None        Note:  This document was prepared using Dragon voice recognition software and may include unintentional dictation errors.   Keturah Shavers 06/26/23 1511    Jene Every, MD 06/26/23 [phone]

## 2023-06-26 NOTE — H&P (Addendum)
Consult History and Physical   SERVICE: Gynecology [facility] unassigned   Patient Name: Carrie Webb [facility] Patient MRN:   [id number]  CC: RLQ pain   HPI: Carrie Webb is a 42 y.o. No obstetric history on file. with rLQ pain starting 3 days ago . + N/V . No fever , but + chills. Decreased appetite G3P2 c/s x2    Review of Systems: positives in bold GEN:   fevers, chills, weight changes, appetite changes, fatigue, night sweats HEENT:  HA, vision changes, hearing loss, congestion, rhinorrhea, sinus pressure, dysphagia CV:   CP, palpitations PULM:  SOB, cough GI:  +abd pain, +N/V/ GU:  dysuria, urgency, frequency MSK:  arthralgias, myalgias, back pain, swelling SKIN:  rashes, color changes, pallor NEURO:  numbness, weakness, tingling, seizures, dizziness, tremors PSYCH:  depression, anxiety, behavioral problems, confusion  HEME/LYMPH:  easy bruising or bleeding ENDO:  heat/cold intolerance  Past Obstetrical History: OB History   No obstetric history on file.     Past Gynecologic History: Patient's last menstrual period was 06/12/2023 (approximate).   Past Medical History: History reviewed. No pertinent past medical history.  Past Surgical History:   Past Surgical History:  Procedure Laterality Date   CESAREAN SECTION     CHOLECYSTECTOMY     KIDNEY SURGERY      Family History:  family history includes Healthy in her father and mother.  Social History:  Social History   Socioeconomic History   Marital status: Married    Spouse name: Not on file   Number of children: Not on file   Years of education: Not on file   Highest education level: Not on file  Occupational History   Not on file  Tobacco Use   Smoking status: Never   Smokeless tobacco: Never  Vaping Use   Vaping status: Never Used  Substance and Sexual Activity   Alcohol use: Yes    Comment: social drinker   Drug use: Never   Sexual activity: Not on file  Other Topics Concern   Not on file   Social History Narrative   Not on file   Social Determinants of Health   Financial Resource Strain: Not on file  Food Insecurity: Not on file  Transportation Needs: Not on file  Physical Activity: Not on file  Stress: Not on file  Social Connections: Not on file  Intimate Partner Violence: Not on file    Home Medications:  Medications reconciled in EPIC  No current facility-administered medications on file prior to encounter.   Current Outpatient Medications on File Prior to Encounter  Medication Sig Dispense Refill   [DISCONTINUED] gabapentin (NEURONTIN) 300 MG capsule Take 1 capsule (300 mg total) by mouth 3 (three) times daily. 30 capsule 0    Allergies:  Allergies  Allergen Reactions   Penicillins Rash   Aspirin Swelling   Penicillin G Swelling    Physical Exam:  Temp:  [98.2 F (36.8 C)-98.7 F (37.1 C)] 98.7 F (37.1 C) (09/03 1019) Pulse Rate:  [93-98] 94 (09/03 1430) Resp:  [16-18] 18 (09/03 1430) BP: (101-116)/(67-89) 101/76 (09/03 1430) SpO2:  [92 %-100 %] 100 % (09/03 1430) Weight:  [72.6 kg] 72.6 kg (09/03 1016)   General Appearance:  Well developed, well nourished, no acute distress, alert and oriented x3 HEENT:  Normocephalic atraumatic, extraocular movements intact, moist mucous membranes Cardiovascular:  Normal S1/S2, regular rate and rhythm, no murmurs Pulmonary:  clear to auscultation, no wheezes, rales or rhonchi, symmetric air entry, good air exchange  Abdomen:  Bowel sounds present, soft, + 2-3+ / 4 RLQ TTP . No rebound Extremities:  Full range of motion, no pedal edema, 2+ distal pulses, no tenderness Skin:  normal coloration and turgor, no rashes, no suspicious skin lesions noted  Neurologic:  Cranial nerves 2-12 grossly intact, normal muscle tone, strength 5/5 all four extremities Psychiatric:  Normal mood and affect, appropriate, no AH/VH Pelvic: bedside bimanual. Mild CMT  ++ Right Adnexal TTP   Labs/Studies:   CBC and Coags:  Lab  Results  Component Value Date   WBC 18.0 (H) 06/26/2023   HGB 14.1 06/26/2023   HCT 41.8 06/26/2023   MCV 89.1 06/26/2023   PLT 276 06/26/2023   CMP:  Lab Results  Component Value Date   NA 135 06/26/2023   K 3.8 06/26/2023   CL 100 06/26/2023   CO2 23 06/26/2023   BUN 8 06/26/2023   CREATININE 0.92 06/26/2023   CREATININE 0.77 03/13/2020   CREATININE 0.82 03/12/2020   PROT 8.5 (H) 06/26/2023   BILITOT 0.8 06/26/2023   ALT 13 06/26/2023   AST 16 06/26/2023   ALKPHOS 72 06/26/2023    Other Imaging: CT ABDOMEN PELVIS W CONTRAST  Result Date: 06/26/2023 CLINICAL DATA:  Right lower quadrant abdominal pain EXAM: CT ABDOMEN AND PELVIS WITH CONTRAST TECHNIQUE: Multidetector CT imaging of the abdomen and pelvis was performed using the standard protocol following bolus administration of intravenous contrast. RADIATION DOSE REDUCTION: This exam was performed according to the departmental dose-optimization program which includes automated exposure control, adjustment of the mA and/or kV according to patient size and/or use of iterative reconstruction technique. CONTRAST:  OMNIPAQUE IOHEXOL 300 MG/ML  SOLN COMPARISON:  None Available. FINDINGS: Lower chest: No acute abnormality. Hepatobiliary: No focal liver abnormality is seen. Status post cholecystectomy. No biliary dilatation. Pancreas: Unremarkable. No pancreatic ductal dilatation or surrounding inflammatory changes. Spleen: Normal in size without focal abnormality. Adrenals/Urinary Tract: Normal adrenal glands. No hydronephrosis, nephrolithiasis or enhancing renal mass. Focal scarring present at the posterior upper pole of the right kidney and also anterolaterally in the interpolar kidney. Findings suggest sequelae of prior infection/inflammation, reflux, or less likely infarct. The ureters and bladder are unremarkable. Stomach/Bowel: The appendix is normal. No evidence of bowel obstruction. There is an inflammatory process isolated to the  right adnexa which results in inflammatory stranding adjacent to but inferior to the appendix extending to the cecal base. Mildly dilated tubular structure consistent with pyosalpinx. Overall, the findings are consistent with a small tubo-ovarian abscess. Vascular/Lymphatic: No significant vascular findings are present. No enlarged abdominal or pelvic lymph nodes. Reproductive: As described above, probable right tubo-ovarian abscess with small pyosalpinx. The uterus and left adnexa are unremarkable. Other: No abdominal wall hernia or abnormality. No abdominopelvic ascites. Musculoskeletal: No acute fracture or aggressive appearing lytic or blastic osseous lesion. IMPRESSION: 1. CT findings are most consistent with small right tubo-ovarian abscess. Fluid collections are small and not amenable to percutaneous drainage. 2. Secondary inflammatory changes extend to the cecal base. 3. The appendix is normal and drapes over the cephalad aspect of the right adnexal inflammatory process. 4. Surgical changes of prior cholecystectomy. Electronically Signed   By: Malachy Moan M.D.   On: 06/26/2023 13:08     Assessment / Plan:   Carrie Webb is a 42 y.o. No obstetric history on file. who presents with RLQ TTP  and u/s c/w right TOA. Presentation differential possible appendicitis  eventhough CTSCAN didn't point to this . + nitrite ,  r/o UTI   1.schedule U/S to better assess the right adnexa Start Cefoxitin 2 gm IV q 6 hr and 100 mg Doxycycline q12 hr Admission .  If no improvement in 24 hr will consult Gen sx for eval  Morphine IV 1-2 q hr prn pain  Toradol 30 mg IV q8 prn pain   Tylenol 1000 mg po q 8 hrs prn pain  60 minutes in patient care   Thank you for the opportunity to be involved with this pt's care.

## 2023-06-26 NOTE — ED Triage Notes (Signed)
Pt via POV from home. Pt c/o RLQ pain since Saturday. Reports she started having vomiting this AM. Pt went to UC this AM and sent pt over here for possible appendicitis. Pt is A&Ox4 and NAD, ambulatory to triage. Pt has a hx of cholecystectomy.   Urinalysis without POC preg done at UC this AM

## 2023-06-26 NOTE — Discharge Instructions (Signed)
Please have your mother take you directly to the emergency room for further evaluation and management as I am concerned about your appendix.

## 2023-06-27 LAB — CBC
HCT: 35.6 % — ABNORMAL LOW (ref 36.0–46.0)
Hemoglobin: 12 g/dL (ref 12.0–15.0)
MCH: 30.4 pg (ref 26.0–34.0)
MCHC: 33.7 g/dL (ref 30.0–36.0)
MCV: 90.1 fL (ref 80.0–100.0)
Platelets: 228 10*3/uL (ref 150–400)
RBC: 3.95 MIL/uL (ref 3.87–5.11)
RDW: 12.7 % (ref 11.5–15.5)
WBC: 12.6 10*3/uL — ABNORMAL HIGH (ref 4.0–10.5)
nRBC: 0 % (ref 0.0–0.2)

## 2023-06-27 LAB — COMPREHENSIVE METABOLIC PANEL
ALT: 79 U/L — ABNORMAL HIGH (ref 0–44)
AST: 91 U/L — ABNORMAL HIGH (ref 15–41)
Albumin: 3.3 g/dL — ABNORMAL LOW (ref 3.5–5.0)
Alkaline Phosphatase: 148 U/L — ABNORMAL HIGH (ref 38–126)
Anion gap: 8 (ref 5–15)
BUN: 11 mg/dL (ref 6–20)
CO2: 22 mmol/L (ref 22–32)
Calcium: 8 mg/dL — ABNORMAL LOW (ref 8.9–10.3)
Chloride: 104 mmol/L (ref 98–111)
Creatinine, Ser: 0.87 mg/dL (ref 0.44–1.00)
GFR, Estimated: >60 mL/min (ref >60)
Glucose, Bld: 81 mg/dL (ref 70–99)
Potassium: 3.5 mmol/L (ref 3.5–5.1)
Sodium: 134 mmol/L — ABNORMAL LOW (ref 135–145)
Total Bilirubin: 1 mg/dL (ref 0.3–1.2)
Total Protein: 6.7 g/dL (ref 6.5–8.1)

## 2023-06-27 MED ORDER — PROMETHAZINE HCL 25 MG RE SUPP
12.5000 mg | Freq: Once | RECTAL | Status: AC
  Administered 2023-06-27: 12.5 mg via RECTAL
  Filled 2023-06-27: qty 1

## 2023-06-27 MED ORDER — ONDANSETRON HCL 4 MG/2ML IJ SOLN
4.0000 mg | INTRAMUSCULAR | Status: DC | PRN
  Administered 2023-06-27: 4 mg via INTRAVENOUS

## 2023-06-27 MED ORDER — SODIUM CHLORIDE 0.9 % IV SOLN
8.0000 mg | Freq: Four times a day (QID) | INTRAVENOUS | Status: DC | PRN
  Administered 2023-06-27 – 2023-06-28 (×5): 8 mg via INTRAVENOUS
  Filled 2023-06-27 (×6): qty 4

## 2023-06-27 MED ORDER — PROMETHAZINE HCL 25 MG RE SUPP: 25.0000 mg | Freq: Once | RECTAL | Status: DC

## 2023-06-27 MED ORDER — HYDROMORPHONE HCL 1 MG/ML IJ SOLN
1.0000 mg | INTRAMUSCULAR | Status: DC | PRN
  Administered 2023-06-27 – 2023-06-28 (×8): 1 mg via INTRAVENOUS
  Filled 2023-06-27 (×8): qty 1

## 2023-06-27 NOTE — Consult Note (Signed)
SURGICAL CONSULTATION NOTE   HISTORY OF PRESENT ILLNESS (HPI):  42 y.o. female presented to [facility] ED for evaluation of abdominal pain. Patient reports she started having abdominal pain 4 days ago.  Pain localized to the right lower quadrant.  No pain radiation.  Pain aggravated by movement of the abdominal wall or palpation.  No alleviating factors.  She went to the ED for evaluation and she was tenderness to palpation in the right lower quadrant.  Labs showed leukocytosis.  She had a CT scan of the abdomen and pelvis that shows a right lower quadrant abscess most likely coming from the tubo-ovarian region.  I personally evaluated the images.  I agree with the radiologist.  The appendix cannot be visualized with normal wall and normal base without sign of obstruction or inflammation.  The appendix is very close to the abscess.  Surgery is consulted by Dr. Jean Rosenthal in this context for evaluation and management of right lower quadrant pain.  PAST MEDICAL HISTORY (PMH):  History reviewed. No pertinent past medical history.   PAST SURGICAL HISTORY (PSH):  Past Surgical History:  Procedure Laterality Date   CESAREAN SECTION     CHOLECYSTECTOMY     KIDNEY SURGERY       MEDICATIONS:  Prior to Admission medications   Medication Sig Start Date End Date Taking? Authorizing Provider  gabapentin (NEURONTIN) 300 MG capsule Take 1 capsule (300 mg total) by mouth 3 (three) times daily. 03/17/20 07/26/20  Leafy Ro, MD     ALLERGIES:  Allergies  Allergen Reactions   Penicillins Rash   Aspirin Swelling   Penicillin G Swelling     SOCIAL HISTORY:  Social History   Socioeconomic History   Marital status: Married    Spouse name: Not on file   Number of children: Not on file   Years of education: Not on file   Highest education level: Not on file  Occupational History   Not on file  Tobacco Use   Smoking status: Never   Smokeless tobacco: Never  Vaping Use   Vaping status: Never Used   Substance and Sexual Activity   Alcohol use: Yes    Comment: social drinker   Drug use: Never   Sexual activity: Not on file  Other Topics Concern   Not on file  Social History Narrative   Not on file   Social Determinants of Health   Financial Resource Strain: Not on file  Food Insecurity: Not on file  Transportation Needs: Not on file  Physical Activity: Not on file  Stress: Not on file  Social Connections: Not on file  Intimate Partner Violence: Not on file      FAMILY HISTORY:  Family History  Problem Relation Age of Onset   Healthy Mother    Healthy Father      REVIEW OF SYSTEMS:  Constitutional: denies weight loss, fever, chills, or sweats  Eyes: denies any other vision changes, history of eye injury  ENT: denies sore throat, hearing problems  Respiratory: denies shortness of breath, wheezing  Cardiovascular: denies chest pain, palpitations  Gastrointestinal: Positive abdominal pain, nausea and vomiting Genitourinary: denies burning with urination or urinary frequency Musculoskeletal: denies any other joint pains or cramps  Skin: denies any other rashes or skin discolorations  Neurological: denies any other headache, dizziness, weakness  Psychiatric: denies any other depression, anxiety   All other review of systems were negative   VITAL SIGNS:  Temp:  [97.6 F (36.4 C)-99.2 F (37.3 C)]  98.7 F (37.1 C) (09/04 1138) Pulse Rate:  [68-94] 77 (09/04 1138) Resp:  [18-20] 18 (09/04 1138) BP: (86-100)/(61-70) 98/61 (09/04 1138) SpO2:  [95 %-100 %] 100 % (09/04 1138)     Height: 5\' 4"  (162.6 cm) Weight: 72.6 kg BMI (Calculated): 27.45   INTAKE/OUTPUT:  This shift: Total I/O In: -  Out: 100 [Urine:100]  Last 2 shifts: @IOLAST2SHIFTS @   PHYSICAL EXAM:  Constitutional:  -- Normal body habitus  -- Awake, alert, and oriented x3  Eyes:  -- Pupils equally round and reactive to light  -- No scleral icterus  Ear, nose, and throat:  -- No jugular venous  distension  Pulmonary:  -- No crackles  -- Equal breath sounds bilaterally -- Breathing non-labored at rest Cardiovascular:  -- S1, S2 present  -- No pericardial rubs Gastrointestinal:  -- Abdomen soft, tender to palpation in the right lower quadrant, non-distended, no guarding or rebound tenderness -- No abdominal masses appreciated, pulsatile or otherwise  Musculoskeletal and Integumentary:  -- Wounds: None appreciated -- Extremities: B/L UE and LE FROM, hands and feet warm, no edema  Neurologic:  -- Motor function: intact and symmetric -- Sensation: intact and symmetric   Labs:     Latest Ref Rng & Units 06/27/2023    7:02 AM 06/26/2023   10:17 AM 03/13/2020    4:58 AM  CBC  WBC 4.0 - 10.5 K/uL 12.6  18.0  5.6   Hemoglobin 12.0 - 15.0 g/dL 78.2  95.6  21.3   Hematocrit 36.0 - 46.0 % 35.6  41.8  35.0   Platelets 150 - 400 K/uL 228  276  191       Latest Ref Rng & Units 06/27/2023    7:02 AM 06/26/2023   10:17 AM 03/13/2020    4:58 AM  CMP  Glucose 70 - 99 mg/dL 81  086  578   BUN 6 - 20 mg/dL 11  8  8    Creatinine 0.44 - 1.00 mg/dL 4.69  6.29  5.28   Sodium 135 - 145 mmol/L 134  135  138   Potassium 3.5 - 5.1 mmol/L 3.5  3.8  3.6   Chloride 98 - 111 mmol/L 104  100  110   CO2 22 - 32 mmol/L 22  23  23    Calcium 8.9 - 10.3 mg/dL 8.0  9.2  7.8   Total Protein 6.5 - 8.1 g/dL 6.7  8.5  5.6   Total Bilirubin 0.3 - 1.2 mg/dL 1.0  0.8  0.8   Alkaline Phos 38 - 126 U/L 148  72  38   AST 15 - 41 U/L 91  16  13   ALT 0 - 44 U/L 79  13  9      Imaging studies:  EXAM: CT ABDOMEN AND PELVIS WITH CONTRAST   TECHNIQUE: Multidetector CT imaging of the abdomen and pelvis was performed using the standard protocol following bolus administration of intravenous contrast.   RADIATION DOSE REDUCTION: This exam was performed according to the departmental dose-optimization program which includes automated exposure control, adjustment of the mA and/or kV according to patient size  and/or use of iterative reconstruction technique.   CONTRAST:  OMNIPAQUE IOHEXOL 300 MG/ML  SOLN   COMPARISON:  None Available.   FINDINGS: Lower chest: No acute abnormality.   Hepatobiliary: No focal liver abnormality is seen. Status post cholecystectomy. No biliary dilatation.   Pancreas: Unremarkable. No pancreatic ductal dilatation or surrounding inflammatory changes.   Spleen:  Normal in size without focal abnormality.   Adrenals/Urinary Tract: Normal adrenal glands. No hydronephrosis, nephrolithiasis or enhancing renal mass. Focal scarring present at the posterior upper pole of the right kidney and also anterolaterally in the interpolar kidney. Findings suggest sequelae of prior infection/inflammation, reflux, or less likely infarct. The ureters and bladder are unremarkable.   Stomach/Bowel: The appendix is normal. No evidence of bowel obstruction. There is an inflammatory process isolated to the right adnexa which results in inflammatory stranding adjacent to but inferior to the appendix extending to the cecal base. Mildly dilated tubular structure consistent with pyosalpinx. Overall, the findings are consistent with a small tubo-ovarian abscess.   Vascular/Lymphatic: No significant vascular findings are present. No enlarged abdominal or pelvic lymph nodes.   Reproductive: As described above, probable right tubo-ovarian abscess with small pyosalpinx. The uterus and left adnexa are unremarkable.   Other: No abdominal wall hernia or abnormality. No abdominopelvic ascites.   Musculoskeletal: No acute fracture or aggressive appearing lytic or blastic osseous lesion.   IMPRESSION: 1. CT findings are most consistent with small right tubo-ovarian abscess. Fluid collections are small and not amenable to percutaneous drainage. 2. Secondary inflammatory changes extend to the cecal base. 3. The appendix is normal and drapes over the cephalad aspect of the right  adnexal inflammatory process. 4. Surgical changes of prior cholecystectomy.     Electronically Signed   By: Malachy Moan M.D.   On: 06/26/2023 13:08  Assessment/Plan:  42 y.o. female with tubo-ovarian abscess.  GYN service consulted patient due to concern of appendicitis since the appendix is very close to the abscess in the right lower quadrant.  I concur with the radiologist that the appendix cannot be visualized without sign of obstruction.  There is air within the appendix which goes against obstruction of the appendix.  Also no periappendiceal stranding other than the one coming from the abscess.  CT scan and pelvic ultrasound confirming that abscess most likely coming from tubo-ovarian abscess or hydrosalpinx.  At this point I am not concerned of appendicitis.  I do not recommend surgical intervention for appendectomy.  Continue management of tubo-ovarian abscess as per gynecology team.   Gae Gallop, MD

## 2023-06-27 NOTE — Progress Notes (Signed)
Daily Benign Gynecology Progress Note Carrie Webb  [id number]  HD#2  Chief Complaint: right lower quadrant pain, abscess  Subjective:  Overnight Events: no acute events Complaints: still with severe pain, nausea, and vomiting She denies: fevers, chills, chest pain, trouble breathing.  She has tolerated: no diet. She had a small amount of clear liquids and crackers and vomited shortly after She reports her pain is not well controlled with morphine and has been changed to dilaudid.   She is ambulating and is voiding.   Objective:  Most recent vitals Temp: 98.7 F (37.1 C)  BP: 98/61  Pulse Rate: 77  Resp: 18  SpO2: 100 %   Vitals Range over 24 hours Temp  Avg: 98.4 F (36.9 C)  Min: 97.6 F (36.4 C)  Max: 99.2 F (37.3 C) BP  Min: 86/65  Max: 105/70 Pulse  Avg: 85  Min: 68  Max: 96 SpO2  Avg: 97 %  Min: 93 %  Max: 100 %   Physical Exam Constitutional:      General: She is not in acute distress.    Appearance: Normal appearance. She is well-developed.  HENT:     Head: Normocephalic and atraumatic.  Eyes:     General: No scleral icterus.    Conjunctiva/sclera: Conjunctivae normal.  Cardiovascular:     Rate and Rhythm: Normal rate and regular rhythm.     Heart sounds: No murmur heard.    No friction rub. No gallop.  Pulmonary:     Effort: Pulmonary effort is normal. No respiratory distress.     Breath sounds: Normal breath sounds. No wheezing or rales.  Abdominal:     General: Bowel sounds are normal. There is no distension.     Palpations: Abdomen is soft. There is no mass.     Tenderness: There is abdominal tenderness (worse in RLQ). There is no guarding or rebound.  Musculoskeletal:        General: Normal range of motion.     Cervical back: Normal range of motion and neck supple.  Neurological:     General: No focal deficit present.     Mental Status: She is alert and oriented to person, place, and time.     Cranial Nerves: No cranial nerve deficit.  Skin:     General: Skin is warm and dry.     Findings: No erythema.  Psychiatric:        Mood and Affect: Mood normal.        Behavior: Behavior normal.        Judgment: Judgment normal.      AM Labs Lab Results  Component Value Date   WBC 12.6 (H) 06/27/2023   HGB 12.0 06/27/2023   HCT 35.6 (L) 06/27/2023   PLT 228 06/27/2023   NA 134 (L) 06/27/2023   K 3.5 06/27/2023   CREATININE 0.87 06/27/2023   BUN 11 06/27/2023     Assessment:  Carrie Webb is a 42 y.o. female HD#2 admitted with suspected TOA.    Plan:  Will get surgery consult given proximity of abscess to appendix Continue pain/nausea control Continue antibiotics for TOA per CDC recommendations Advance diet as tolerated. Currently NPO Anticipated Discharge: HD#3-4  Thomasene Mohair, MD  06/27/2023 1:21 PM

## 2023-06-27 NOTE — Plan of Care (Signed)
Pt still reports nausea and vomiting along with persistent pain in RLQ. MD notified. Orders to start dilaudid q3h PRN and have the  zofran dose increased to 8mg . Pt appears to be much more comfortable with dilaudid. Pt reports pain relief lasts longer with dilaudid but it still returns at the same intensity once medication wears off. Pt's nausea improved with zofran and 1 x dose of phenergan. Pt agreed to trying PRN zofran and attempting to try clear liquids again for dinner tonight.

## 2023-06-28 LAB — CBC
HCT: 33.6 % — ABNORMAL LOW (ref 36.0–46.0)
Hemoglobin: 11.3 g/dL — ABNORMAL LOW (ref 12.0–15.0)
MCH: 30.5 pg (ref 26.0–34.0)
MCHC: 33.6 g/dL (ref 30.0–36.0)
MCV: 90.8 fL (ref 80.0–100.0)
Platelets: 233 10*3/uL (ref 150–400)
RBC: 3.7 MIL/uL — ABNORMAL LOW (ref 3.87–5.11)
RDW: 12.5 % (ref 11.5–15.5)
WBC: 10.2 10*3/uL (ref 4.0–10.5)
nRBC: 0 % (ref 0.0–0.2)

## 2023-06-28 LAB — COMPREHENSIVE METABOLIC PANEL
ALT: 45 U/L — ABNORMAL HIGH (ref 0–44)
AST: 29 U/L (ref 15–41)
Albumin: 3 g/dL — ABNORMAL LOW (ref 3.5–5.0)
Alkaline Phosphatase: 105 U/L (ref 38–126)
Anion gap: 6 (ref 5–15)
BUN: 6 mg/dL (ref 6–20)
CO2: 24 mmol/L (ref 22–32)
Calcium: 7.9 mg/dL — ABNORMAL LOW (ref 8.9–10.3)
Chloride: 105 mmol/L (ref 98–111)
Creatinine, Ser: 0.86 mg/dL (ref 0.44–1.00)
GFR, Estimated: >60 mL/min (ref >60)
Glucose, Bld: 102 mg/dL — ABNORMAL HIGH (ref 70–99)
Potassium: 3.4 mmol/L — ABNORMAL LOW (ref 3.5–5.1)
Sodium: 135 mmol/L (ref 135–145)
Total Bilirubin: 0.2 mg/dL — ABNORMAL LOW (ref 0.3–1.2)
Total Protein: 5.8 g/dL — ABNORMAL LOW (ref 6.5–8.1)

## 2023-06-28 MED ORDER — PROMETHAZINE HCL 25 MG RE SUPP
12.5000 mg | Freq: Once | RECTAL | Status: AC
  Administered 2023-06-28: 12.5 mg via RECTAL
  Filled 2023-06-28: qty 1

## 2023-06-28 MED ORDER — KETOROLAC TROMETHAMINE 30 MG/ML IJ SOLN
30.0000 mg | Freq: Four times a day (QID) | INTRAMUSCULAR | Status: DC | PRN
  Administered 2023-06-28 – 2023-06-29 (×4): 30 mg via INTRAVENOUS
  Filled 2023-06-28 (×4): qty 1

## 2023-06-28 MED ORDER — SODIUM CHLORIDE 0.9 % IV SOLN
12.5000 mg | Freq: Once | INTRAVENOUS | Status: DC
  Filled 2023-06-28: qty 0.5

## 2023-06-28 MED ORDER — SODIUM CHLORIDE 0.9 % IV SOLN
12.5000 mg | Freq: Four times a day (QID) | INTRAVENOUS | Status: DC | PRN
  Administered 2023-06-28 – 2023-06-29 (×2): 12.5 mg via INTRAVENOUS
  Filled 2023-06-28 (×2): qty 12.5

## 2023-06-28 NOTE — Progress Notes (Signed)
Daily Benign Gynecology Progress Note Carrie Webb  [id number]  HD#3  Chief Complaint: right lower quadrant pain, abscess  Subjective:  Overnight Events: no acute events Complaints: still with severe pain, nausea, and vomiting She denies: fevers, chills, chest pain, trouble breathing.  She has tolerated: small quantities of clear liquids at a slow consumption pace She reports her pain is moderately well controlled with dilaudid.   She is ambulating and is voiding.   Objective:  Most recent vitals Temp: (!) 97.2 F (36.2 C)  BP: 97/74  Pulse Rate: 79  Resp: 20  SpO2: 99 %   Vitals Range over 24 hours Temp  Avg: 98.2 F (36.8 C)  Min: 97.2 F (36.2 C)  Max: 99 F (37.2 C) BP  Min: 87/62  Max: 99/68 Pulse  Avg: 79  Min: 72  Max: 85 SpO2  Avg: 97.2 %  Min: 93 %  Max: 100 %   Physical Exam Constitutional:      General: She is not in acute distress.    Appearance: Normal appearance. She is well-developed.  HENT:     Head: Normocephalic and atraumatic.  Eyes:     General: No scleral icterus.    Conjunctiva/sclera: Conjunctivae normal.  Cardiovascular:     Rate and Rhythm: Normal rate and regular rhythm.     Heart sounds: No murmur heard.    No friction rub. No gallop.  Pulmonary:     Effort: Pulmonary effort is normal. No respiratory distress.     Breath sounds: Normal breath sounds. No wheezing or rales.  Abdominal:     General: Bowel sounds are normal. There is no distension.     Palpations: Abdomen is soft. There is no mass.     Tenderness: There is abdominal tenderness (worse in RLQ). There is no guarding or rebound.  Musculoskeletal:        General: Normal range of motion.     Cervical back: Normal range of motion and neck supple.  Neurological:     General: No focal deficit present.     Mental Status: She is alert and oriented to person, place, and time.     Cranial Nerves: No cranial nerve deficit.  Skin:    General: Skin is warm and dry.     Findings: No  erythema.  Psychiatric:        Mood and Affect: Mood normal.        Behavior: Behavior normal.        Judgment: Judgment normal.      AM Labs Lab Results  Component Value Date   WBC 10.2 06/28/2023   HGB 11.3 (L) 06/28/2023   HCT 33.6 (L) 06/28/2023   PLT 233 06/28/2023   NA 135 06/28/2023   K 3.4 (L) 06/28/2023   CREATININE 0.86 06/28/2023   BUN 6 06/28/2023   Recent Labs    06/26/23 1017 06/27/23 0702 06/28/23 0627  AST 16 91* 29  ALT 13 79* 45*     Assessment:  Carrie Webb is a 42 y.o. female HD#3 admitted with suspected TOA.    Plan:  Surgery consult yesterday. They do not believe she has appendicitis. Appreciate input. Continue pain/nausea control. Will add IV toradol today to see if IV pain medication contributing to nausea. Continue antibiotics for TOA per CDC recommendations Advance diet as tolerated. Currently clear liquids  Consider re-imaging tomorrow to assess for response. Still with too much tenderness and not tolerating PO well enough for discharge today. Encouraged  ambulation and will continue SCDs for VTE prophylaxis Anticipated Discharge: HD#3-4  Thomasene Mohair, MD  06/28/2023 10:34 AM

## 2023-06-28 NOTE — Plan of Care (Signed)
Pt still has pain 7-8/10 every 2.5-3 hours with nausea; no emesis this shift; pt is needing iv pain med every 3 hours and iv nausea med every 6 hours; pain is still lower right abdomen "feels like spasms"; no referred pain just localized pain; pt has only had a few sips of liquids and some ice this shift

## 2023-06-28 NOTE — Plan of Care (Signed)
Pt still having nausea this shift.  MD notified. PRN phenergan added. Toradol introduced for pain management.

## 2023-06-29 MED ORDER — DIPHENHYDRAMINE HCL 50 MG/ML IJ SOLN
25.0000 mg | Freq: Once | INTRAMUSCULAR | Status: AC
  Administered 2023-06-29: 25 mg via INTRAVENOUS
  Filled 2023-06-29: qty 1

## 2023-06-29 MED ORDER — IBUPROFEN 800 MG PO TABS: 800.0000 mg | ORAL_TABLET | Freq: Three times a day (TID) | ORAL | 0 refills | Status: DC | PRN

## 2023-06-29 MED ORDER — CEFUROXIME AXETIL 500 MG PO TABS: 500.0000 mg | ORAL_TABLET | Freq: Two times a day (BID) | ORAL | 0 refills | Status: DC

## 2023-06-29 MED ORDER — METRONIDAZOLE 500 MG PO TABS: 500.0000 mg | ORAL_TABLET | Freq: Three times a day (TID) | ORAL | 0 refills | Status: AC

## 2023-06-29 MED ORDER — DOXYCYCLINE MONOHYDRATE 100 MG PO CAPS: 100.0000 mg | ORAL_CAPSULE | Freq: Two times a day (BID) | ORAL | 0 refills | Status: AC

## 2023-06-29 MED ORDER — OXYCODONE-ACETAMINOPHEN 5-325 MG PO TABS: 1.0000 | ORAL_TABLET | Freq: Three times a day (TID) | ORAL | 0 refills | Status: AC | PRN

## 2023-06-29 NOTE — Progress Notes (Signed)
Patient c/o raised rash on arms, chest, stomach and some tightness/puffiness around face. No more nausea. Patient given phenergan prior to dose of cefoxtin and about 2 hours after antibiotic, patient had reaction. Dr Feliberto Gottron notified with order to give IV benadryl. Vitals WDL. No issues with breathing. Will continue to monitor

## 2023-06-29 NOTE — Discharge Summary (Signed)
Physician Discharge Summary  Patient ID: Carrie Webb MRN: [id number] DOB/AGE: [birth year] 42 y.o.  Admit date: 06/26/2023 Discharge date: 06/29/2023  Admission Diagnoses:right tuboovarian abscess  Discharge Diagnoses: same Principal Problem:   Tubo-ovarian abscess   Discharged Condition: good  Hospital Course: pt admitted with RLQ pain and ctscan and TVUS c/w a right TOA  IV cefoxitin and Iv doxycycline started . Over the 4 days of hospitalization  pain decreased and wbc decreased . Gen sx consult done HD #2 and they felt pain was not due to appendicitis .     Consults: general surgery  Significant Diagnostic Studies: labs:  Results for orders placed or performed during the hospital encounter of 06/26/23 (from the past 72 hour(s))  Comprehensive metabolic panel     Status: Abnormal   Collection Time: 06/27/23  7:02 AM  Result Value Ref Range   Sodium 134 (L) 135 - 145 mmol/L   Potassium 3.5 3.5 - 5.1 mmol/L   Chloride 104 98 - 111 mmol/L   CO2 22 22 - 32 mmol/L   Glucose, Bld 81 70 - 99 mg/dL    Comment: Glucose reference range applies only to samples taken after fasting for at least 8 hours.   BUN 11 6 - 20 mg/dL   Creatinine, Ser 7.82 0.44 - 1.00 mg/dL   Calcium 8.0 (L) 8.9 - 10.3 mg/dL   Total Protein 6.7 6.5 - 8.1 g/dL   Albumin 3.3 (L) 3.5 - 5.0 g/dL   AST 91 (H) 15 - 41 U/L   ALT 79 (H) 0 - 44 U/L   Alkaline Phosphatase 148 (H) 38 - 126 U/L   Total Bilirubin 1.0 0.3 - 1.2 mg/dL   GFR, Estimated >95 >62 mL/min    Comment: (NOTE) Calculated using the CKD-EPI Creatinine Equation (2021)    Anion gap 8 5 - 15    Comment: Performed at [facility], [address] [place], [place] [address]  CBC     Status: Abnormal   Collection Time: 06/27/23  7:02 AM  Result Value Ref Range   WBC 12.6 (H) 4.0 - 10.5 K/uL   RBC 3.95 3.87 - 5.11 MIL/uL   Hemoglobin 12.0 12.0 - 15.0 g/dL   HCT 57.8 (L) 46.9 - 62.9 %   MCV 90.1 80.0 - 100.0 fL   MCH 30.4 26.0 - 34.0 pg    MCHC 33.7 30.0 - 36.0 g/dL   RDW 52.8 41.3 - 24.4 %   Platelets 228 150 - 400 K/uL   nRBC 0.0 0.0 - 0.2 %    Comment: Performed at [facility], [address] [place], [place] [address]  CBC     Status: Abnormal   Collection Time: 06/28/23  6:27 AM  Result Value Ref Range   WBC 10.2 4.0 - 10.5 K/uL   RBC 3.70 (L) 3.87 - 5.11 MIL/uL   Hemoglobin 11.3 (L) 12.0 - 15.0 g/dL   HCT 25.3 (L) 66.4 - 40.3 %   MCV 90.8 80.0 - 100.0 fL   MCH 30.5 26.0 - 34.0 pg   MCHC 33.6 30.0 - 36.0 g/dL   RDW 47.4 25.9 - 56.3 %   Platelets 233 150 - 400 K/uL   nRBC 0.0 0.0 - 0.2 %    Comment: Performed at [facility], [address] [place], [place] [address]  Comprehensive metabolic panel     Status: Abnormal   Collection Time: 06/28/23  6:27 AM  Result Value Ref Range   Sodium  135 135 - 145 mmol/L   Potassium 3.4 (L) 3.5 - 5.1 mmol/L   Chloride 105 98 - 111 mmol/L   CO2 24 22 - 32 mmol/L   Glucose, Bld 102 (H) 70 - 99 mg/dL    Comment: Glucose reference range applies only to samples taken after fasting for at least 8 hours.   BUN 6 6 - 20 mg/dL   Creatinine, Ser 3.76 0.44 - 1.00 mg/dL   Calcium 7.9 (L) 8.9 - 10.3 mg/dL   Total Protein 5.8 (L) 6.5 - 8.1 g/dL   Albumin 3.0 (L) 3.5 - 5.0 g/dL   AST 29 15 - 41 U/L   ALT 45 (H) 0 - 44 U/L   Alkaline Phosphatase 105 38 - 126 U/L   Total Bilirubin 0.2 (L) 0.3 - 1.2 mg/dL   GFR, Estimated >28 >31 mL/min    Comment: (NOTE) Calculated using the CKD-EPI Creatinine Equation (2021)    Anion gap 6 5 - 15    Comment: Performed at [facility], [address] [place], [place] [address]   U/s on admission : TECHNIQUE: Both transabdominal and transvaginal ultrasound examinations of the pelvis were performed. Transabdominal technique was performed for global imaging of the pelvis including uterus, ovaries, adnexal regions, and pelvic cul-de-sac. It was necessary to proceed with endovaginal exam following the  transabdominal exam to visualize the adnexa and endometrium.   COMPARISON:  CT earlier 06/26/2023   FINDINGS: Uterus   Measurements: 8.7 x 4.8 x 6.1 cm = volume: 131.6 mL. No fibroids or other mass visualized.   Endometrium   Thickness: 11 mm.  No focal abnormality visualized.   Right ovary   Measurements: 2.6 x 1.9 x 2.3 cm = volume: 5.8 mL. Follicles are seen. Adjacent to the right ovary is a cystic lesion which is complex measuring 4.9 x 3.6 x 3.0 cm   Left ovary   Measurements: 3.1 x 2.4 x 2.9 cm = volume: 11.3 mL. Normal appearance/no adnexal mass.   Other findings   Small amount of free fluid in the pelvis.    Treatments: iv abx   Discharge Exam: Blood pressure 104/77, pulse 84, temperature 98.4 F (36.9 C), temperature source Oral, resp. rate 19, height 5\' 4"  (1.626 m), weight 72.6 kg, last menstrual period 06/12/2023, SpO2 96%. General appearance: alert and cooperative Resp: clear to auscultation bilaterally GI: soft, non-tender; bowel sounds normal; no masses,  no organomegaly  Disposition: Discharge disposition: 01-Home or Self Care     d/c home  Ceftin500 mg bid , flagyl 500mg  tid Docycycline 100 mg po bid for 10 days  Percocet 5/325mg  po prn  Motrin 800 mg tid prn  Follow up with TJS at [place] in 10 days for u/s and exam .  Discharge Instructions     Call MD for:   Complete by: As directed    Increasing abdominal pain   Call MD for:  difficulty breathing, headache or visual disturbances   Complete by: As directed    Call MD for:  extreme fatigue   Complete by: As directed    Call MD for:  hives   Complete by: As directed    Call MD for:  persistant dizziness or light-headedness   Complete by: As directed    Call MD for:  persistant nausea and vomiting   Complete by: As directed    Call MD for:  redness, tenderness, or signs of infection (pain, swelling, redness, odor or green/yellow discharge around incision site)  Complete by: As  directed    Call MD for:  severe uncontrolled pain   Complete by: As directed    Call MD for:  temperature >100.4   Complete by: As directed    Diet - low sodium heart healthy   Complete by: As directed    Increase activity slowly   Complete by: As directed       Allergies as of 06/29/2023       Reactions   Penicillins Rash   Aspirin Swelling   Penicillin G Swelling        Medication List     TAKE these medications    cefUROXime 500 MG tablet Commonly known as: CEFTIN Take 1 tablet (500 mg total) by mouth 2 (two) times daily with a meal.   doxycycline 100 MG capsule Commonly known as: MONODOX Take 1 capsule (100 mg total) by mouth 2 (two) times daily.   ibuprofen 800 MG tablet Commonly known as: ADVIL Take 1 tablet (800 mg total) by mouth every 8 (eight) hours as needed.   metroNIDAZOLE 500 MG tablet Commonly known as: Flagyl Take 1 tablet (500 mg total) by mouth 3 (three) times daily for 7 days.   oxyCODONE-acetaminophen 5-325 MG tablet Commonly known as: Percocet Take 1 tablet by mouth every 8 (eight) hours as needed for severe pain.        Follow-up Information     Ehren Berisha, Ihor Austin, MD. Go on 07/11/2023.   Specialty: Obstetrics and Gynecology Why: call [facility] for pelvic u/s and follow up visit with TJS after  Ultrasound at 1:30PM and follow up with Dr. at 2:15PM. Contact information: [address] [place] [address] [phone]                 Signed: Ihor Austin Shaia Porath 06/29/2023, 3:58 PM

## 2023-06-29 NOTE — Progress Notes (Signed)
Patient discharged home with family.  Discharge instructions, when to follow up, and prescriptions reviewed with patient.  Patient verbalized understanding. Patient will be escorted out by auxiliary.   

## 2023-07-02 NOTE — Group Note (Deleted)

## 2023-07-25 ENCOUNTER — Emergency Department: Payer: 59

## 2023-07-25 ENCOUNTER — Other Ambulatory Visit: Payer: Self-pay

## 2023-07-25 ENCOUNTER — Emergency Department
Admission: EM | Admit: 2023-07-25 | Discharge: 2023-07-25 | Disposition: A | Discharge status: Home / Self Care | Payer: 59 | Attending: Emergency Medicine | Admitting: Emergency Medicine

## 2023-07-25 DIAGNOSIS — N739 Female pelvic inflammatory disease, unspecified: Secondary | ICD-10-CM

## 2023-07-25 DIAGNOSIS — N12 Tubulo-interstitial nephritis, not specified as acute or chronic: Secondary | ICD-10-CM | POA: Insufficient documentation

## 2023-07-25 DIAGNOSIS — R1031 Right lower quadrant pain: Secondary | ICD-10-CM | POA: Diagnosis present

## 2023-07-25 LAB — CBC
HCT: 40.8 % (ref 36.0–46.0)
Hemoglobin: 14.1 g/dL (ref 12.0–15.0)
MCH: 30.3 pg (ref 26.0–34.0)
MCHC: 34.6 g/dL (ref 30.0–36.0)
MCV: 87.6 fL (ref 80.0–100.0)
Platelets: 236 10*3/uL (ref 150–400)
RBC: 4.66 MIL/uL (ref 3.87–5.11)
RDW: 13.3 % (ref 11.5–15.5)
WBC: 8.7 10*3/uL (ref 4.0–10.5)
nRBC: 0 % (ref 0.0–0.2)

## 2023-07-25 LAB — COMPREHENSIVE METABOLIC PANEL
ALT: 16 U/L (ref 0–44)
AST: 20 U/L (ref 15–41)
Albumin: 4 g/dL (ref 3.5–5.0)
Alkaline Phosphatase: 48 U/L (ref 38–126)
Anion gap: 7 (ref 5–15)
BUN: 11 mg/dL (ref 6–20)
CO2: 24 mmol/L (ref 22–32)
Calcium: 9.1 mg/dL (ref 8.9–10.3)
Chloride: 103 mmol/L (ref 98–111)
Creatinine, Ser: 0.86 mg/dL (ref 0.44–1.00)
GFR, Estimated: >60 mL/min (ref >60)
Glucose, Bld: 95 mg/dL (ref 70–99)
Potassium: 4.1 mmol/L (ref 3.5–5.1)
Sodium: 134 mmol/L — ABNORMAL LOW (ref 135–145)
Total Bilirubin: 0.4 mg/dL (ref 0.3–1.2)
Total Protein: 7.8 g/dL (ref 6.5–8.1)

## 2023-07-25 LAB — URINALYSIS, ROUTINE W REFLEX MICROSCOPIC
Bilirubin Urine: NEGATIVE
Glucose, UA: NEGATIVE mg/dL
Ketones, ur: NEGATIVE mg/dL
Nitrite: POSITIVE — AB
Protein, ur: NEGATIVE mg/dL
Specific Gravity, Urine: 1.009 (ref 1.005–1.030)
Squamous Epithelial / HPF: >50 /[HPF] (ref 0–5)
pH: 5 (ref 5.0–8.0)

## 2023-07-25 LAB — CHLAMYDIA/NGC RT PCR (ARMC ONLY)
Chlamydia Tr: NOT DETECTED
N gonorrhoeae: NOT DETECTED

## 2023-07-25 LAB — WET PREP, GENITAL
Clue Cells Wet Prep HPF POC: NONE SEEN
Sperm: NONE SEEN
Trich, Wet Prep: NONE SEEN
WBC, Wet Prep HPF POC: <10 (ref <10)
Yeast Wet Prep HPF POC: NONE SEEN

## 2023-07-25 LAB — LIPASE, BLOOD: Lipase: 26 U/L (ref 11–51)

## 2023-07-25 LAB — POC URINE PREG, ED: Preg Test, Ur: NEGATIVE

## 2023-07-25 MED ORDER — LEVOFLOXACIN IN D5W 750 MG/150ML IV SOLN
750.0000 mg | Freq: Once | INTRAVENOUS | Status: AC
  Administered 2023-07-25: 750 mg via INTRAVENOUS
  Filled 2023-07-25: qty 150

## 2023-07-25 MED ORDER — ONDANSETRON HCL 4 MG/2ML IJ SOLN
4.0000 mg | Freq: Once | INTRAMUSCULAR | Status: AC
  Administered 2023-07-25: 4 mg via INTRAVENOUS
  Filled 2023-07-25: qty 2

## 2023-07-25 MED ORDER — SODIUM CHLORIDE 0.9 % IV BOLUS
1000.0000 mL | Freq: Once | INTRAVENOUS | Status: AC
  Administered 2023-07-25: 1000 mL via INTRAVENOUS

## 2023-07-25 MED ORDER — LEVOFLOXACIN 750 MG PO TABS: 750.0000 mg | ORAL_TABLET | Freq: Every day | ORAL | 0 refills | Status: AC

## 2023-07-25 MED ORDER — KETOROLAC TROMETHAMINE 15 MG/ML IJ SOLN
15.0000 mg | Freq: Once | INTRAMUSCULAR | Status: AC
  Administered 2023-07-25: 15 mg via INTRAVENOUS
  Filled 2023-07-25: qty 1

## 2023-07-25 MED ORDER — IOHEXOL 300 MG/ML  SOLN
100.0000 mL | Freq: Once | INTRAMUSCULAR | Status: AC | PRN
  Administered 2023-07-25: 100 mL via INTRAVENOUS

## 2023-07-25 NOTE — ED Provider Notes (Signed)
[facility] Provider Note    Event Date/Time   First MD Initiated Contact with Patient 07/25/23 1617     (approximate)   History   Abdominal Pain   HPI  Carrie Webb is a 42 y.o. female past medical history significant for tubo-ovarian abscess to the right ovary, who presents to the emergency department with right-sided abdominal pain.  Started having abdominal pain that was recurrent and felt similar to prior episode of tubo-ovarian abscess.  Recently evaluated at [facility] and had an ultrasound done that did not show any signs of an abscess.  Returns for ongoing pain that has worsened in the past 24 hours.  1 episode of vomiting.  Denies dysuria, urinary urgency or frequency.  Does state that the pain sometimes is so severe it radiates up to her right back.  Denies any history of kidney stones.  Prior cholecystectomy.       Physical Exam   Triage Vital Signs: ED Triage Vitals  Encounter Vitals Group     BP 07/25/23 1545 115/85     Systolic BP Percentile --      Diastolic BP Percentile --      Pulse Rate 07/25/23 1545 77     Resp 07/25/23 1545 18     Temp 07/25/23 1545 98.3 F (36.8 C)     Temp Source 07/25/23 1545 Oral     SpO2 07/25/23 1545 100 %     Weight 07/25/23 1555 160 lb (72.6 kg)     Height 07/25/23 1555 5\' 4"  (1.626 m)     Head Circumference --      Peak Flow --      Pain Score 07/25/23 1554 8     Pain Loc --      Pain Education --      Exclude from Growth Chart --     Most recent vital signs: Vitals:   07/25/23 1545 07/25/23 1926  BP: 115/85 120/78  Pulse: 77 74  Resp: 18 17  Temp: 98.3 F (36.8 C) 97.9 F (36.6 C)  SpO2: 100% 98%    Physical Exam Exam conducted with a chaperone present.  Constitutional:      Appearance: She is well-developed.  HENT:     Head: Atraumatic.  Eyes:     Conjunctiva/sclera: Conjunctivae normal.  Cardiovascular:     Rate and Rhythm: Regular rhythm.  Pulmonary:     Effort: No  respiratory distress.  Abdominal:     General: There is no distension.     Tenderness: There is abdominal tenderness in the right lower quadrant.  Genitourinary:    Vagina: Normal.     Cervix: Normal. No cervical motion tenderness.     Uterus: Normal.      Adnexa: Left adnexa normal.       Right: Tenderness present.   Musculoskeletal:        General: Normal range of motion.     Cervical back: Normal range of motion.  Skin:    General: Skin is warm.  Neurological:     Mental Status: She is alert. Mental status is at baseline.     IMPRESSION / MDM / ASSESSMENT AND PLAN / ED COURSE  I reviewed the triage vital signs and the nursing notes.  Differential diagnosis including tubo-ovarian abscess, ovarian torsion, pyelonephritis, kidney stone, appendicitis, ectopic pregnancy  No tachycardic or bradycardic dysrhythmias while on cardiac telemetry.  RADIOLOGY I independently reviewed imaging, my interpretation of imaging: Transvaginal ultrasound -no  obvious abscess.  Read as no acute findings.  CT scan abdomen and pelvis with contrast without obvious appendicitis.  No signs of kidney stones or hydronephrosis. -Read as no acute findings.  LABS (all labs ordered are listed, but only abnormal results are displayed) Labs interpreted as -    Labs Reviewed  COMPREHENSIVE METABOLIC PANEL - Abnormal; Notable for the following components:      Result Value   Sodium 134 (*)    All other components within normal limits  URINALYSIS, ROUTINE W REFLEX MICROSCOPIC - Abnormal; Notable for the following components:   Color, Urine YELLOW (*)    APPearance CLOUDY (*)    Hgb urine dipstick SMALL (*)    Nitrite POSITIVE (*)    Leukocytes,Ua TRACE (*)    Bacteria, UA RARE (*)    All other components within normal limits  WET PREP, GENITAL  CHLAMYDIA/NGC RT PCR ([facility] ONLY)            URINE CULTURE  LIPASE, BLOOD  CBC  POC URINE PREG, ED     MDM  Patient was given IV fluids, IV Zofran and  ketorolac  On chart review patient had an admission for tubo-ovarian abscess, received IV antibiotics and was discharged on Ceftin, Flagyl and doxycycline.  Patient did endorse symptoms concerning for an allergic reaction to Ceftin.  Added on to her allergy list.  Does have a history of allergy to penicillin.   Lab work overall reassuring with no leukocytosis.  Creatinine at baseline with no significant electrolyte abnormalities.  UA concerning for urinary tract infection with positive nitrites and leukocyte esterase.  Urine was sent for culture.  On chart review no history of a resistant urinary tract infection.  Given multiple allergies we will treat the patient with IV Levaquin.  Will treat the patient with pyelonephritis given her abdominal pain with nausea and vomiting.  Discussed close follow-up, given a referral for primary care physician.  Given return precautions for any ongoing or worsening symptoms.  No questions at time of discharge.      PROCEDURES:  Critical Care performed: No  Procedures  Patient's presentation is most consistent with acute presentation with potential threat to life or bodily function.   MEDICATIONS ORDERED IN ED: Medications  levofloxacin (LEVAQUIN) IVPB 750 mg (750 mg Intravenous New Bag/Given 07/25/23 2005)  sodium chloride 0.9 % bolus 1,000 mL (0 mLs Intravenous Stopped 07/25/23 1845)  ketorolac (TORADOL) 15 MG/ML injection 15 mg (15 mg Intravenous Given 07/25/23 1732)  ondansetron (ZOFRAN) injection 4 mg (4 mg Intravenous Given 07/25/23 1732)  iohexol (OMNIPAQUE) 300 MG/ML solution 100 mL (100 mLs Intravenous Contrast Given 07/25/23 1847)    FINAL CLINICAL IMPRESSION(S) / ED DIAGNOSES   Final diagnoses:  Pyelonephritis     Rx / DC Orders   ED Discharge Orders          Ordered    Ambulatory Referral to Primary Care (Establish Care)        07/25/23 1947    levofloxacin (LEVAQUIN) 750 MG tablet  Daily        07/25/23 1953              Note:  This document was prepared using Dragon voice recognition software and may include unintentional dictation errors.   Corena Herter, MD 07/25/23 2102

## 2023-07-25 NOTE — ED Notes (Signed)
Patient transported to CT 

## 2023-07-25 NOTE — ED Triage Notes (Signed)
Pt sts that she has been having right lower abd pain the entire day. Pt sts that she recently had an abscess on her right ovary and was admitted for IV abx.

## 2023-07-25 NOTE — Discharge Instructions (Addendum)
You are seen in the urgency department for right-sided abdominal pain.  You had a urine test that was concerning for urinary tract infection.  You had an ultrasound that did not show any abscess to your ovary.  You also had a CT scan that did not show any signs of appendicitis, abscess or any other abnormalities.  You are given your first dose of IV antibiotics in the emergency department.  You are given a prescription for antibiotics, you will be notified and a couple of days of these antibiotics do not cover the infection that you have after urine culture has resulted.  Return to the emergency department if you have any ongoing symptoms or worsening symptoms.  Pain control:  Ibuprofen (motrin/aleve/advil) - You can take 3 tablets (600 mg) every 6 hours as needed for pain/fever.  Acetaminophen (tylenol) - You can take 2 extra strength tablets (1000 mg) every 6 hours as needed for pain/fever.  You can alternate these medications or take them together.  Make sure you eat food/drink water when taking these medications.  Thank you for choosing [place] for your health care, it was my pleasure to care for you today!  Corena Herter, MD

## 2023-07-28 LAB — URINE CULTURE: Culture: >=100000 — AB

## 2023-09-20 IMAGING — CR DG LUMBAR SPINE COMPLETE 4+V
5 series · 5 of 5 positions shown · non-contrast
Comparison: None.

CLINICAL DATA: Lumbar spine pain for 6 weeks.

EXAM:
LUMBAR SPINE - COMPLETE 4+ VIEW

[l-spine obl (1 of 2)]
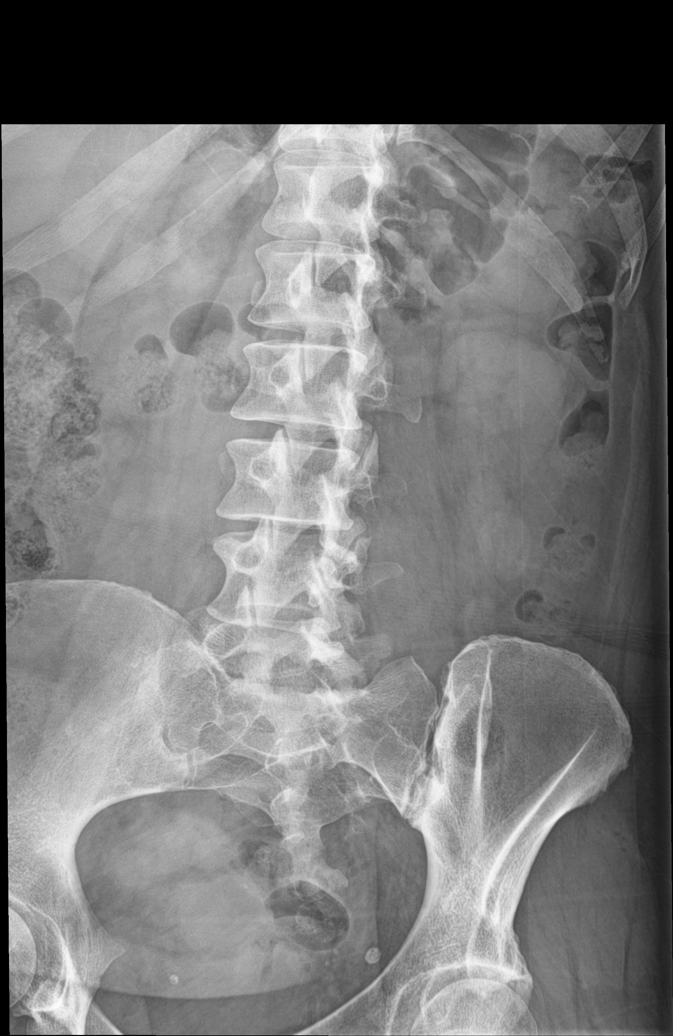

[l-spine obl (2 of 2)]
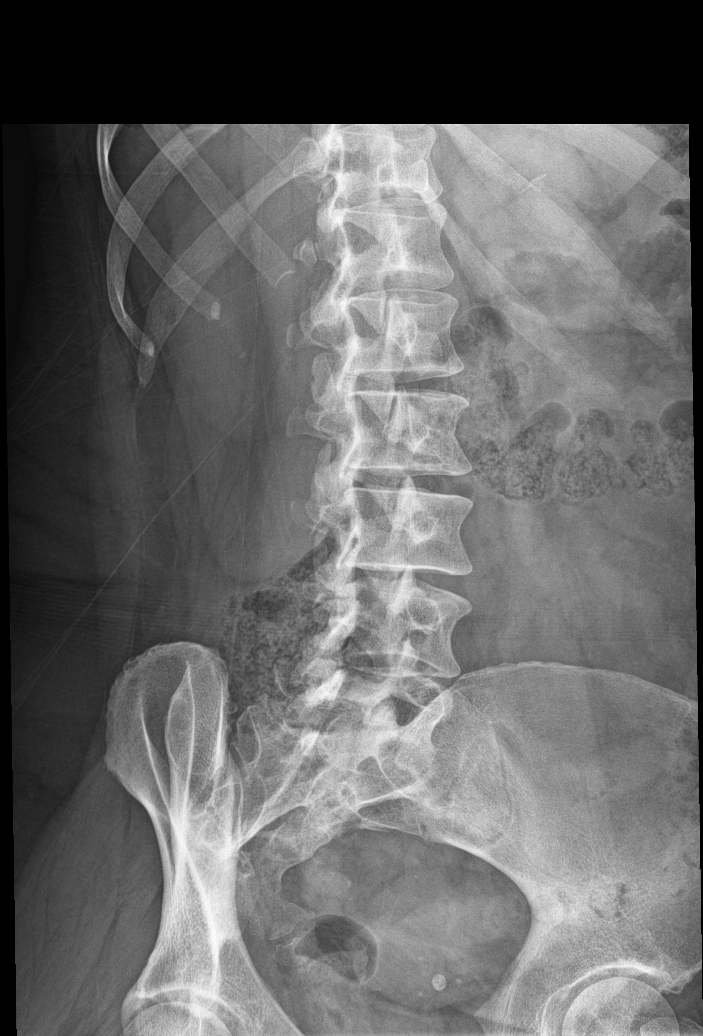

[l-spine lat]
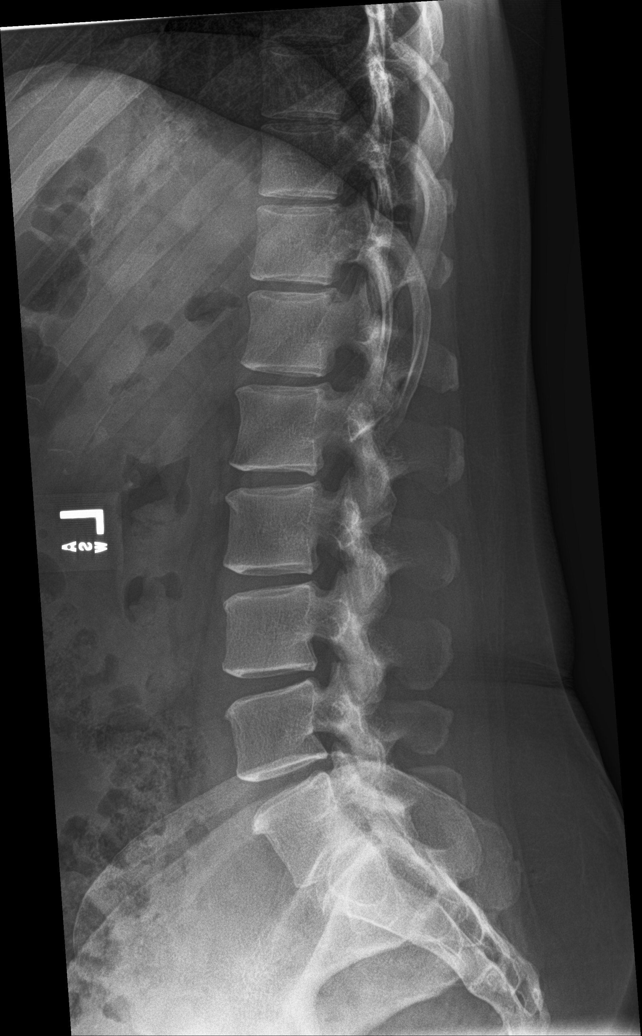

[l-spine spot]
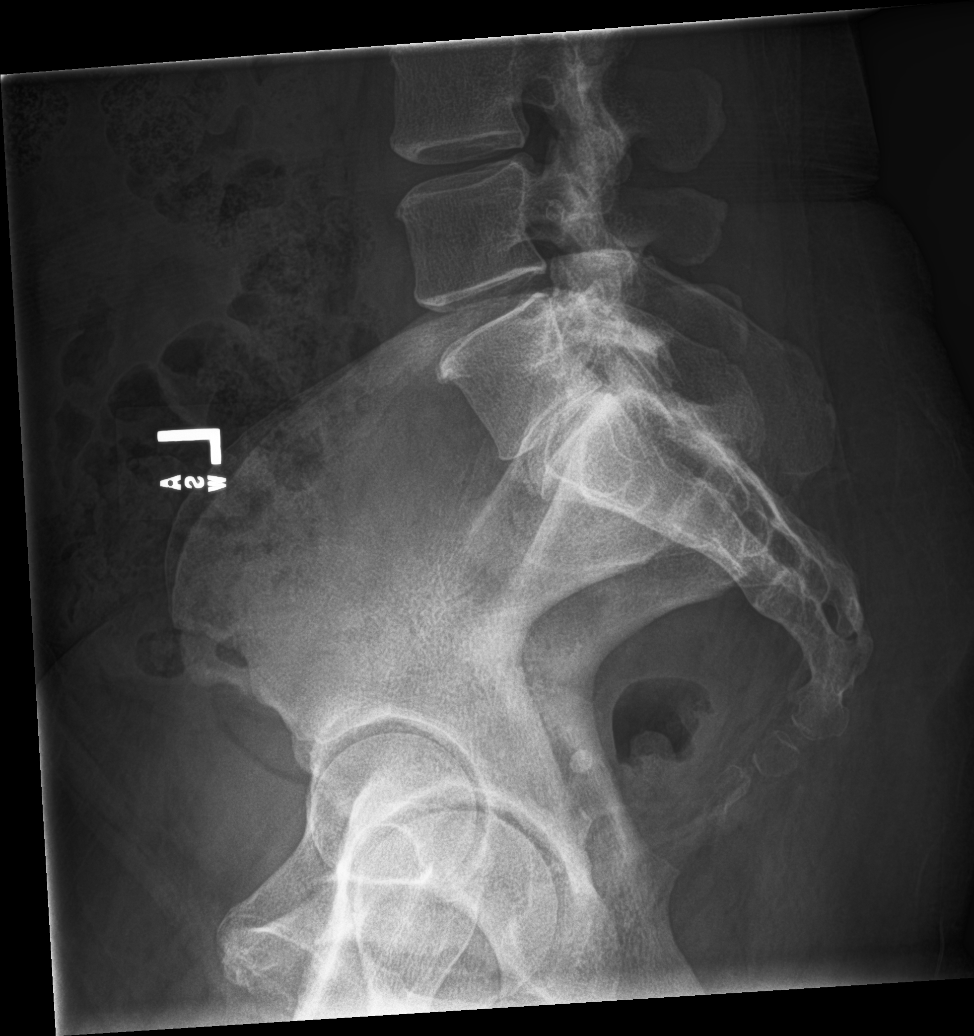

[l-spine ap]
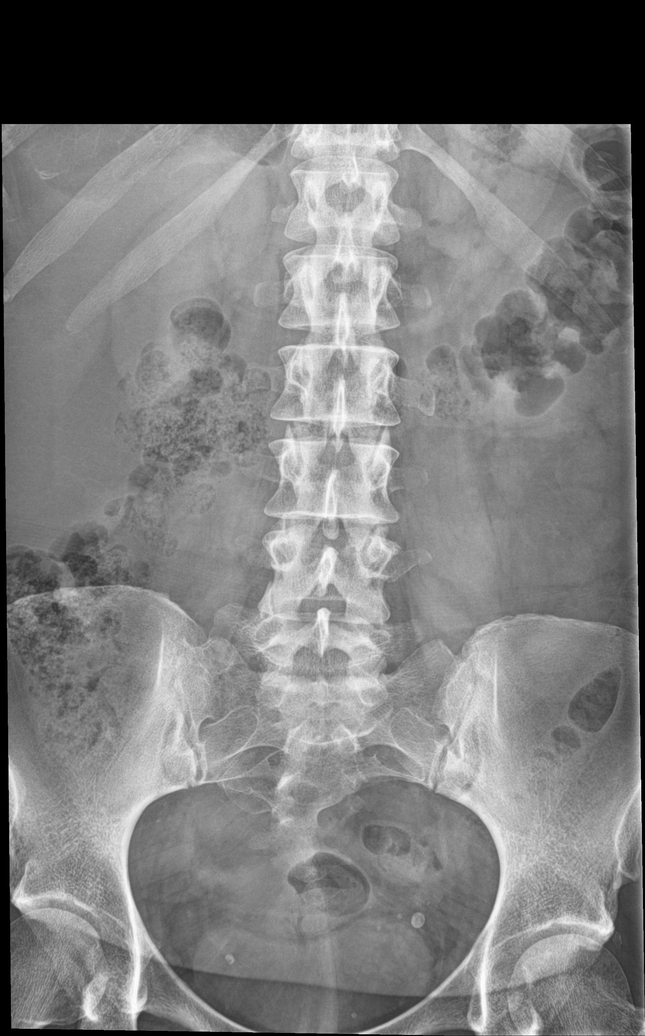

[5 of 5 positions shown; findings below may reference images not displayed]

FINDINGS: There is transitional lumbosacral anatomy. The first non rib-bearing
vertebral body may have a segmented right transverse process and is
considered T12. Distal to this the next 5 vertebral bodies are
considered L1 through L5 with the L4 vertebral body predominantly
above the iliac crest on lateral view. There is partial
sacralization of L5. Normal sagittal alignment. Vertebral body
heights are maintained. Mild L5-S1 disc space narrowing. Mild
anterior L4-5 endplate spurring. The bilateral sacroiliac joint
spaces are maintained. Vascular phleboliths overlie the pelvis.
IMPRESSION: :
IMPRESSION: 1. Transitional lumbosacral anatomy as described above.
2. Mild L5-S1 disc space narrowing.

## 2024-02-13 ENCOUNTER — Ambulatory Visit
Admission: RE | Admit: 2024-02-13 | Discharge: 2024-02-13 | Disposition: A | Discharge status: Home / Self Care | Source: Ambulatory Visit | Attending: Emergency Medicine | Admitting: Emergency Medicine

## 2024-02-13 VITALS — BP 122/88 | HR 71 | Temp 98.3°F | Resp 16 | Ht 64.0 in | Wt 165.0 lb

## 2024-02-13 DIAGNOSIS — R5383 Other fatigue: Secondary | ICD-10-CM | POA: Diagnosis not present

## 2024-02-13 DIAGNOSIS — Z758 Other problems related to medical facilities and other health care: Secondary | ICD-10-CM | POA: Diagnosis not present

## 2024-02-13 LAB — GLUCOSE, CAPILLARY: Glucose-Capillary: 97 mg/dL (ref 70–99)

## 2024-02-13 NOTE — ED Provider Notes (Signed)
 [facility]    CSN: [id number] Arrival date & time: 02/13/24  1335      History   Chief Complaint Chief Complaint  Patient presents with   Blood Sugar Problem    HPI Carrie Webb is a 43 y.o. female.   43 year old female, Carrie Webb, presents to urgent care for evaluation of concern regarding high blood sugar over past week.  Patient states that she has a history of low blood sugar and has a blood glucose machine and whenever she feels like her blood sugar is high she checks it. Pt reports BS was 185 1 hour after eating. Pt denies any chest pain,shortness of breath,no bleeding issue, cough, nausea,vomiting at present. Pt states when her BS was high she felt "swimmy headed and tired", none at present. 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     The history is provided by the patient. No language interpreter was used.    History reviewed. No pertinent past medical history.  Patient Active Problem List   Diagnosis Date Noted   Does not have primary care provider 02/13/2024   Fatigue 02/13/2024   Tubo-ovarian abscess 06/26/2023   Acute cholecystitis 03/12/2020    Past Surgical History:  Procedure Laterality Date   CESAREAN SECTION     CHOLECYSTECTOMY     KIDNEY SURGERY      OB History   No obstetric history on file.      Home Medications    Prior to Admission medications   Medication Sig Start Date End Date Taking? Authorizing Provider  cefUROXime  (CEFTIN ) 500 MG tablet Take 1 tablet (500 mg total) by mouth 2 (two) times daily with a meal. 06/29/23   Schermerhorn, Joselyn Nicely, MD  doxycycline  (MONODOX ) 100 MG capsule Take 1 capsule (100 mg total) by mouth 2 (two) times daily. 06/29/23   Schermerhorn, Joselyn Nicely, MD  ibuprofen  (ADVIL ) 800 MG tablet Take 1 tablet (800 mg total) by mouth every 8 (eight) hours as needed. 06/29/23   Schermerhorn, Joselyn Nicely, MD  oxyCODONE -acetaminophen  (PERCOCET) 5-325 MG tablet Take 1 tablet by mouth  every 8 (eight) hours as needed for severe pain. 06/29/23 06/28/24  Schermerhorn, Joselyn Nicely, MD  gabapentin  (NEURONTIN ) 300 MG capsule Take 1 capsule (300 mg total) by mouth 3 (three) times daily. 03/17/20 07/26/20  Alben Alma, MD    Family History Family History  Problem Relation Age of Onset   Healthy Mother    Healthy Father     Social History Social History   Tobacco Use   Smoking status: Never   Smokeless tobacco: Never  Vaping Use   Vaping status: Never Used  Substance Use Topics   Alcohol use: Yes    Comment: social drinker   Drug use: Never     Allergies   Penicillins, Aspirin, Penicillin g, and Cefuroxime    Review of Systems Review of Systems  Constitutional:  Positive for fatigue. Negative for fever.  HENT:  Negative for congestion.   Respiratory:  Negative for cough, chest tightness, shortness of breath and wheezing.   Cardiovascular:  Negative for chest pain, palpitations and leg swelling.  Gastrointestinal:  Negative for nausea and vomiting.  Genitourinary:  Negative for dysuria.  Psychiatric/Behavioral:  The patient is nervous/anxious.   All other systems reviewed and are negative.    Physical Exam Triage Vital Signs ED Triage Vitals  Encounter Vitals Group     BP 02/13/24 1352 122/88  Systolic BP Percentile --      Diastolic BP Percentile --      Pulse Rate 02/13/24 1352 71     Resp 02/13/24 1352 16     Temp 02/13/24 1352 98.3 F (36.8 C)     Temp Source 02/13/24 1352 Oral     SpO2 02/13/24 1352 96 %     Weight 02/13/24 1351 165 lb (74.8 kg)     Height 02/13/24 1351 5\' 4"  (1.626 m)     Head Circumference --      Peak Flow --      Pain Score 02/13/24 1356 0     Pain Loc --      Pain Education --      Exclude from Growth Chart --    No data found.  Updated Vital Signs BP 122/88 (BP Location: Right Arm)   Pulse 71   Temp 98.3 F (36.8 C) (Oral)   Resp 16   Ht 5\' 4"  (1.626 m)   Wt 165 lb (74.8 kg)   LMP 02/11/2024 (Exact Date)    SpO2 96%   BMI 28.32 kg/m   Visual Acuity Right Eye Distance:   Left Eye Distance:   Bilateral Distance:    Right Eye Near:   Left Eye Near:    Bilateral Near:     Physical Exam Vitals and nursing note reviewed.  Constitutional:      General: She is not in acute distress.    Appearance: She is well-developed and well-groomed.  HENT:     Head: Normocephalic and atraumatic.     Right Ear: Tympanic membrane normal.     Left Ear: Tympanic membrane normal.     Nose: Nose normal.     Mouth/Throat:     Lips: Pink.     Mouth: Mucous membranes are moist.     Pharynx: Oropharynx is clear. Uvula midline.  Eyes:     Conjunctiva/sclera: Conjunctivae normal.  Cardiovascular:     Rate and Rhythm: Normal rate and regular rhythm.     Heart sounds: Normal heart sounds. No murmur heard. Pulmonary:     Effort: Pulmonary effort is normal. No respiratory distress.     Breath sounds: Normal breath sounds and air entry.  Abdominal:     Palpations: Abdomen is soft.     Tenderness: There is no abdominal tenderness.  Musculoskeletal:        General: No swelling.     Cervical back: Neck supple.  Skin:    General: Skin is warm and dry.     Capillary Refill: Capillary refill takes less than 2 seconds.  Neurological:     General: No focal deficit present.     Mental Status: She is alert and oriented to person, place, and time.     GCS: GCS eye subscore is 4. GCS verbal subscore is 5. GCS motor subscore is 6.  Psychiatric:        Attention and Perception: Attention normal.        Mood and Affect: Mood is anxious.        Speech: Speech normal.        Behavior: Behavior is cooperative.      UC Treatments / Results  Labs (all labs ordered are listed, but only abnormal results are displayed) Labs Reviewed  GLUCOSE, CAPILLARY  CBG MONITORING, ED    EKG   Radiology No results found.  Procedures Procedures (including critical care time)  Medications Ordered in UC Medications - No  data to display  Initial Impression / Assessment and Plan / UC Course  I have reviewed the triage vital signs and the nursing notes.  Pertinent labs & imaging results that were available during my care of the patient were reviewed by me and considered in my medical decision making (see chart for details).  Clinical Course as of 02/13/24 1456  Wed Feb 13, 2024  1420 Pt states she does not have a PCP nor has she had a physical in recent years. Offered referral to PCP and pt declined states she is "waiting on a call back for appt".  [JD]    Clinical Course User Index [JD] Sherly Brodbeck, Eveleen Hinds, NP   Discussed normal exam findings ,vital signs, reassured pt and offered to refer to PCP, strict go to ER precautions given. Patient verbalized understanding to this provider.  Ddx: Fatigue, anxiety, viral illness, endocrine disorder,electrolyte imbalance Final Clinical Impressions(s) / UC Diagnoses   Final diagnoses:  Does not have primary care provider  Fatigue, unspecified type     Discharge Instructions      Please get established with PCP of your choice for physical/further evaluation(handouts given for primary care possibilities Your vital signs are reassurring and normal in office BS is 97 in office today Eat well balanced meals Drink plenty of water Got to Er for new or worsening issues or concerns(chest pain,shortness of breath, palpitations, etc.      ED Prescriptions   None    PDMP not reviewed this encounter.   Peter Brands, NP 02/13/24 1456

## 2024-02-13 NOTE — Discharge Instructions (Signed)
 Please get established with PCP of your choice for physical/further evaluation(handouts given for primary care possibilities Your vital signs are reassurring and normal in office BS is 97 in office today Eat well balanced meals Drink plenty of water Got to Er for new or worsening issues or concerns(chest pain,shortness of breath, palpitations, etc.

## 2024-02-13 NOTE — ED Triage Notes (Signed)
 Pt c/o BS problem x5 days. States Last couple of days Blood glucose has been high. Highest being 185. Been causing me to feel swimmy headed and tired - Entered by patient

## 2024-07-10 ENCOUNTER — Other Ambulatory Visit: Payer: Self-pay

## 2024-07-10 ENCOUNTER — Encounter: Payer: Self-pay | Admitting: Emergency Medicine

## 2024-07-10 ENCOUNTER — Emergency Department
Admission: EM | Admit: 2024-07-10 | Discharge: 2024-07-10 | Disposition: A | Discharge status: Home / Self Care | Attending: Emergency Medicine | Admitting: Emergency Medicine

## 2024-07-10 DIAGNOSIS — N390 Urinary tract infection, site not specified: Secondary | ICD-10-CM | POA: Insufficient documentation

## 2024-07-10 DIAGNOSIS — R1030 Lower abdominal pain, unspecified: Secondary | ICD-10-CM | POA: Diagnosis present

## 2024-07-10 LAB — COMPREHENSIVE METABOLIC PANEL WITH GFR
ALT: 13 U/L (ref 0–44)
AST: 20 U/L (ref 15–41)
Albumin: 3.6 g/dL (ref 3.5–5.0)
Alkaline Phosphatase: 61 U/L (ref 38–126)
Anion gap: 8 (ref 5–15)
BUN: 10 mg/dL (ref 6–20)
CO2: 25 mmol/L (ref 22–32)
Calcium: 8.8 mg/dL — ABNORMAL LOW (ref 8.9–10.3)
Chloride: 104 mmol/L (ref 98–111)
Creatinine, Ser: 0.96 mg/dL (ref 0.44–1.00)
GFR, Estimated: >60 mL/min (ref >60)
Glucose, Bld: 114 mg/dL — ABNORMAL HIGH (ref 70–99)
Potassium: 3.9 mmol/L (ref 3.5–5.1)
Sodium: 137 mmol/L (ref 135–145)
Total Bilirubin: 0.7 mg/dL (ref 0.0–1.2)
Total Protein: 7.6 g/dL (ref 6.5–8.1)

## 2024-07-10 LAB — URINALYSIS, ROUTINE W REFLEX MICROSCOPIC
RBC / HPF: >50 RBC/hpf (ref 0–5)
WBC, UA: >50 WBC/hpf (ref 0–5)

## 2024-07-10 LAB — CBC
HCT: 43 % (ref 36.0–46.0)
Hemoglobin: 14.6 g/dL (ref 12.0–15.0)
MCH: 30.7 pg (ref 26.0–34.0)
MCHC: 34 g/dL (ref 30.0–36.0)
MCV: 90.3 fL (ref 80.0–100.0)
Platelets: 222 K/uL (ref 150–400)
RBC: 4.76 MIL/uL (ref 3.87–5.11)
RDW: 13.1 % (ref 11.5–15.5)
WBC: 6.4 K/uL (ref 4.0–10.5)
nRBC: 0 % (ref 0.0–0.2)

## 2024-07-10 LAB — POC URINE PREG, ED: Preg Test, Ur: NEGATIVE

## 2024-07-10 LAB — LIPASE, BLOOD: Lipase: 30 U/L (ref 11–51)

## 2024-07-10 MED ORDER — ONDANSETRON HCL 4 MG/2ML IJ SOLN
4.0000 mg | Freq: Once | INTRAMUSCULAR | Status: AC
  Administered 2024-07-10: 4 mg via INTRAVENOUS
  Filled 2024-07-10: qty 2

## 2024-07-10 MED ORDER — CIPROFLOXACIN HCL 500 MG PO TABS: 500.0000 mg | ORAL_TABLET | Freq: Two times a day (BID) | ORAL | 0 refills | Status: AC

## 2024-07-10 MED ORDER — CIPROFLOXACIN IN D5W 400 MG/200ML IV SOLN
400.0000 mg | Freq: Once | INTRAVENOUS | Status: AC
  Administered 2024-07-10: 400 mg via INTRAVENOUS
  Filled 2024-07-10: qty 200

## 2024-07-10 MED ORDER — SODIUM CHLORIDE 0.9 % IV BOLUS
1000.0000 mL | Freq: Once | INTRAVENOUS | Status: AC
  Administered 2024-07-10: 1000 mL via INTRAVENOUS

## 2024-07-10 MED ORDER — ONDANSETRON 4 MG PO TBDP: 4.0000 mg | ORAL_TABLET | Freq: Three times a day (TID) | ORAL | 0 refills | Status: AC | PRN

## 2024-07-10 NOTE — ED Provider Notes (Signed)
 [facility] Provider Note    Event Date/Time   First MD Initiated Contact with Patient 07/10/24 [phone]     (approximate)   History   Abdominal Pain and Back Pain   HPI  Carrie Webb is a 43 y.o. female   presents to the ED with complaint of lower abdominal pain and low back pain for the last 2 days.  Patient has had some nausea but denies any vomiting or diarrhea.  She states that there has been no urinary frequency and currently has her menses.  Patient denies any sick contacts.  Positive history for UTIs and tubo-ovarian abscess.      Physical Exam   Triage Vital Signs: ED Triage Vitals  Encounter Vitals Group     BP 07/10/24 0712 121/79     Girls Systolic BP Percentile --      Girls Diastolic BP Percentile --      Boys Systolic BP Percentile --      Boys Diastolic BP Percentile --      Pulse Rate 07/10/24 0712 96     Resp 07/10/24 0712 18     Temp 07/10/24 0712 98.1 F (36.7 C)     Temp Source 07/10/24 0712 Oral     SpO2 07/10/24 0712 96 %     Weight 07/10/24 0711 165 lb (74.8 kg)     Height 07/10/24 0711 5' 4 (1.626 m)     Head Circumference --      Peak Flow --      Pain Score 07/10/24 0711 8     Pain Loc --      Pain Education --      Exclude from Growth Chart --     Most recent vital signs: Vitals:   07/10/24 0712 07/10/24 0914  BP: 121/79 108/84  Pulse: 96 73  Resp: 18   Temp: 98.1 F (36.7 C)   SpO2: 96% 95%     General: Awake, no distress.  Alert, talkative, nontoxic in appearance. CV:  Good peripheral perfusion.  Heart regular rate rhythm. Resp:  Normal effort.  Lungs clear bilaterally. Abd:  No distention.  Soft, nontender, bowel sounds normoactive x 4 quadrants.  There is some minimal bilateral CVA tenderness on percussion. Other:     ED Results / Procedures / Treatments   Labs (all labs ordered are listed, but only abnormal results are displayed) Labs Reviewed  COMPREHENSIVE METABOLIC PANEL WITH GFR -  Abnormal; Notable for the following components:      Result Value   Glucose, Bld 114 (*)    Calcium 8.8 (*)    All other components within normal limits  URINALYSIS, ROUTINE W REFLEX MICROSCOPIC - Abnormal; Notable for the following components:   Color, Urine RED (*)    APPearance CLOUDY (*)    Glucose, UA   (*)    Value: TEST NOT REPORTED DUE TO COLOR INTERFERENCE OF URINE PIGMENT   Hgb urine dipstick   (*)    Value: TEST NOT REPORTED DUE TO COLOR INTERFERENCE OF URINE PIGMENT   Bilirubin Urine   (*)    Value: TEST NOT REPORTED DUE TO COLOR INTERFERENCE OF URINE PIGMENT   Ketones, ur   (*)    Value: TEST NOT REPORTED DUE TO COLOR INTERFERENCE OF URINE PIGMENT   Protein, ur   (*)    Value: TEST NOT REPORTED DUE TO COLOR INTERFERENCE OF URINE PIGMENT   Nitrite   (*)  Value: TEST NOT REPORTED DUE TO COLOR INTERFERENCE OF URINE PIGMENT   Leukocytes,Ua   (*)    Value: TEST NOT REPORTED DUE TO COLOR INTERFERENCE OF URINE PIGMENT   Bacteria, UA RARE (*)    All other components within normal limits  URINE CULTURE  LIPASE, BLOOD  CBC  POC URINE PREG, ED      PROCEDURES:  Critical Care performed:   Procedures   MEDICATIONS ORDERED IN ED: Medications  ciprofloxacin  (CIPRO ) IVPB 400 mg (0 mg Intravenous Stopped 07/10/24 0949)  ondansetron  (ZOFRAN ) injection 4 mg (4 mg Intravenous Given 07/10/24 0835)  sodium chloride  0.9 % bolus 1,000 mL (0 mLs Intravenous Stopped 07/10/24 1027)     IMPRESSION / MDM / ASSESSMENT AND PLAN / ED COURSE  I reviewed the triage vital signs and the nursing notes.   Differential diagnosis includes, but is not limited to, viral illness, abdominal pain, urolithiasis, acute urinary tract infection, pyelonephritis, diverticulitis.  43 year old female presents to the ED with complaint of abdominal pain and low back pain for the last 2 days.  Patient has had some nausea but denies any vomiting.  She reports that she has had a history of urinary tract  infections but states that she is unaware of any urinary frequency as she tends to drink a lot of fluids anyway.  Urinalysis was consistent with a acute urinary tract infection with greater than 50 RBCs and WBCs and rare bacteria.  Urine culture was obtained.  Patient was given ciprofloxacin  400 mg IV while in the ED and a prescription for the same was sent to the pharmacy for her to take for the next 7 days.  Patient is to increase fluids, Tylenol  or ibuprofen  as needed for discomfort.  She is to return to the emergency department if any severe worsening of her symptoms, vomiting, inability take the antibiotic or chills over the weekend.      Patient's presentation is most consistent with acute illness / injury with system symptoms.  FINAL CLINICAL IMPRESSION(S) / ED DIAGNOSES   Final diagnoses:  Acute urinary tract infection     Rx / DC Orders   ED Discharge Orders          Ordered    ciprofloxacin  (CIPRO ) 500 MG tablet  2 times daily        07/10/24 1014    ondansetron  (ZOFRAN -ODT) 4 MG disintegrating tablet  Every 8 hours PRN        07/10/24 1014    Ambulatory Referral to Primary Care (Establish Care)       Comments: Follow up UTI   07/10/24 1015             Note:  This document was prepared using Dragon voice recognition software and may include unintentional dictation errors.   Saunders Shona CROME, PA-C 07/10/24 1318    Viviann Pastor, MD 07/10/24 [phone]

## 2024-07-10 NOTE — ED Notes (Signed)
 Pt is resting at present with son at bedside

## 2024-07-10 NOTE — ED Notes (Signed)
 43 y.o. patient reporting pain for the past 2 days. She notes back pain and lower abdominal pain, describes the back pain as burning pain and abdominal pain as shooting pain. She reports a fever of 100.2 F this AM and reports fever has since gone down after taking ibuprofen . Has had her period for the past x4 days.

## 2024-07-10 NOTE — Discharge Instructions (Addendum)
 Continue taking ciprofloxacin  for the next 7 days until completely gone.  Increase fluids.  A prescription for Zofran  was sent to the pharmacy to take as needed for nausea.  Return to the emergency department if any severe worsening of your symptoms or not improving.  You may also take Tylenol  or ibuprofen  as needed for back pain or discomfort. A referral was made for follow-up of your urinary tract infection.  A list of clinics is also printed on your discharge papers.  Please go to the following website to schedule new (and existing) patient appointments:   [email or web address]   The following is a list of primary care offices in the area who are accepting new patients at this time.  Please reach out to one of them directly and let them know you would like to schedule an appointment to follow up on an Emergency Department visit, and/or to establish a new primary care provider (PCP).  There are likely other primary care clinics in the are who are accepting new patients, but this is an excellent place to start:  [facility] Lead physician: Dr Jon Eva [address] [place], [place] [address] [phone]  [facility] Lead Physician: Dr Dorette Loron [address], [place], [place] [address] [phone]  [facility]  Lead Physician: Dr Duwaine Louder [address] [place], [place] [address] [phone]  [facility] Lead Physician: Dr Marolyn Officer [address], [place], [place] [address] [phone]  [facility] Primary Care & Sports Medicine at [facility] Lead Physician: Dr Leita Adie [address] [place], [place], [place] [address] [phone]

## 2024-07-10 NOTE — ED Triage Notes (Signed)
 Pt via POV from home. Pt c/o lower abd pain and lower back pain for the past 2 days. Reports some nausea. Denies vomiting. Pt c/o pelvic pain also but denies any urinary symptoms. Reports she had a fever today. Denies any sick contacts. Pt is A&Ox4 and NAD, ambulatory with triage with steady gait.

## 2024-07-12 LAB — URINE CULTURE
Culture: >=100000 — AB
Special Requests: NORMAL

## 2024-09-25 ENCOUNTER — Emergency Department

## 2024-09-25 ENCOUNTER — Emergency Department
Admission: EM | Admit: 2024-09-25 | Discharge: 2024-09-25 | Disposition: A | Discharge status: Home / Self Care | Payer: Worker's Compensation | Attending: Emergency Medicine | Admitting: Emergency Medicine

## 2024-09-25 DIAGNOSIS — S63071A Subluxation of distal end of right ulna, initial encounter: Secondary | ICD-10-CM | POA: Insufficient documentation

## 2024-09-25 DIAGNOSIS — Z5329 Procedure and treatment not carried out because of patient's decision for other reasons: Secondary | ICD-10-CM | POA: Insufficient documentation

## 2024-09-25 DIAGNOSIS — X500XXA Overexertion from strenuous movement or load, initial encounter: Secondary | ICD-10-CM | POA: Insufficient documentation

## 2024-09-25 DIAGNOSIS — Y990 Civilian activity done for income or pay: Secondary | ICD-10-CM | POA: Insufficient documentation

## 2024-09-25 MED ORDER — OXYCODONE-ACETAMINOPHEN 5-325 MG PO TABS
1.0000 | ORAL_TABLET | Freq: Once | ORAL | Status: AC
  Administered 2024-09-25: 1 via ORAL

## 2024-09-25 MED ORDER — OXYCODONE-ACETAMINOPHEN 5-325 MG PO TABS
1.0000 | ORAL_TABLET | Freq: Once | ORAL | Status: DC
  Filled 2024-09-25: qty 1

## 2024-09-25 MED ORDER — PROPOFOL 10 MG/ML IV BOLUS
INTRAVENOUS | Status: AC | PRN
  Administered 2024-09-25: 39.15 mg via INTRAVENOUS

## 2024-09-25 MED ORDER — PROPOFOL 10 MG/ML IV BOLUS
0.5000 mg/kg | Freq: Once | INTRAVENOUS | Status: DC
  Filled 2024-09-25: qty 20

## 2024-09-25 MED ORDER — ONDANSETRON HCL 4 MG/2ML IJ SOLN
4.0000 mg | Freq: Once | INTRAMUSCULAR | Status: AC
  Administered 2024-09-25: 4 mg via INTRAVENOUS
  Filled 2024-09-25: qty 2

## 2024-09-25 NOTE — ED Notes (Signed)
 Pt o2 sats dropped to 90%, placed on 4L [place]

## 2024-09-25 NOTE — ED Provider Notes (Signed)
 [facility] Provider Note    Event Date/Time   First MD Initiated Contact with Patient 09/25/24 [phone]     (approximate)   History   Wrist Pain and Hand Swelling   HPI  Carrie Webb is a 43 y.o. female with no PMH who presents for evaluation of wrist pain.  Patient was seen at urgent care on 12/2 for evaluation of wrist pain after a student twisted her wrist.  She had an x-ray done that showed possible dislocation of the ulna, she was placed in a splint and advised to follow-up with orthopedics.  She returns today with worsening pain and swelling.  Has some tingling in her fingers but no numbness.      Physical Exam   Triage Vital Signs: ED Triage Vitals  Encounter Vitals Group     BP 09/25/24 0848 (!) 122/90     Girls Systolic BP Percentile --      Girls Diastolic BP Percentile --      Boys Systolic BP Percentile --      Boys Diastolic BP Percentile --      Pulse Rate 09/25/24 0848 71     Resp 09/25/24 0848 18     Temp 09/25/24 0848 97.6 F (36.4 C)     Temp Source 09/25/24 0848 Oral     SpO2 09/25/24 0848 98 %     Weight 09/25/24 0847 172 lb 9.6 oz (78.3 kg)     Height 09/25/24 0847 5' 4 (1.626 m)     Head Circumference --      Peak Flow --      Pain Score 09/25/24 0847 9     Pain Loc --      Pain Education --      Exclude from Growth Chart --     Most recent vital signs: Vitals:   09/25/24 1134 09/25/24 1142  BP: 102/74 116/83  Pulse:    Resp:  17  Temp: 98 F (36.7 C)   SpO2:     General: Awake, no distress.  CV:  Good peripheral perfusion.  Resp:  Normal effort.  Abd:  No distention.  Other:  Right hand is swollen, radial pulse 2+ and regular, capillary refill appropriate in all fingertips, patient able to range all of her fingers but does have pain with any movement of the wrist, patient able to give thumbs up, okay sign, cross index over middle finger and perform thumb opposition with each of the fingers, compartments  of the hand and forearm are soft, skin is warm and dry, does have some swelling over the distal ulna but no overlying skin changes   ED Results / Procedures / Treatments   Labs (all labs ordered are listed, but only abnormal results are displayed) Labs Reviewed - No data to display   RADIOLOGY  Multiple right wrist x-rays obtained to evaluate pre and postreduction.  First x-ray shows possible subluxation of the distal ulna, second x-ray obtained post reduction and shows possible reduction.  Third x-ray obtained and confirms reduction.  PROCEDURES:  Critical Care performed: No  .Reduction of dislocation  Date/Time: 09/25/2024 3:34 PM  Performed by: Cleaster Tinnie LABOR, PA-C Authorized by: Cleaster Tinnie LABOR, PA-C  Consent: Verbal consent obtained Risks and benefits: risks, benefits and alternatives were discussed Consent given by: patient Patient understanding: patient states understanding of the procedure being performed Patient consent: the patient's understanding of the procedure matches consent given Imaging studies: imaging studies available Patient  identity confirmed: verbally with patient and arm band Time out: Immediately prior to procedure a time out was called to verify the correct patient, procedure, equipment, support staff and site/side marked as required. Local anesthesia used: no  Anesthesia: Local anesthesia used: no  Sedation: Patient sedated: yes (See separate sedation procedure note by my attending, Dr. Levander)  Patient tolerance: patient tolerated the procedure well with no immediate complications      MEDICATIONS ORDERED IN ED: Medications  propofol  (DIPRIVAN ) 10 mg/mL bolus/IV push 39.2 mg (has no administration in time range)  oxyCODONE -acetaminophen  (PERCOCET/ROXICET) 5-325 MG per tablet 1 tablet (1 tablet Oral Given 09/25/24 1104)  ondansetron  (ZOFRAN ) injection 4 mg (4 mg Intravenous Given 09/25/24 1103)  propofol  (DIPRIVAN ) 10 mg/mL bolus/IV  push (39.15 mg Intravenous Given 09/25/24 1128)     IMPRESSION / MDM / ASSESSMENT AND PLAN / ED COURSE  I reviewed the triage vital signs and the nursing notes.                             43 year old female presents for evaluation of worsening right wrist pain.  Vital signs are stable patient uncomfortable appearing on exam.  Differential diagnosis includes, but is not limited to, fracture, dislocation, sprain, compartment syndrome unlikely.  Patient's presentation is most consistent with acute complicated illness / injury requiring diagnostic workup.  Do not suspect compartment syndrome as patient has good pulses, appropriate capillary refill, sensation and skin is warm.  I reached out to the on-call orthopedic provider, Dr. Ezra for consultation.  He recommended that I reduce the ulna and that it be done under sedation.  Patient was moved to a different part of the ER where she could be better monitored.  My supervising physician, Dr. Levander did the sedation while I did the reduction.  See procedure note for further details.  X-rays were obtained pre and postreduction.  The first x-ray showed subluxation of the distal ulna, second x-ray showed possible reduction but since I was holding the bone in place a third x-ray was obtained to evaluate for the stability of the reduction and it was normal.  Patient was placed in a sugar-tong splint in supination and given an arm sling for comfort.  She was advised to continue to take the previously prescribed pain medication as needed and to follow-up with Dr. Ezra as an outpatient.  Patient voiced understanding, all questions were answered and she was stable at discharge.   FINAL CLINICAL IMPRESSION(S) / ED DIAGNOSES   Final diagnoses:  Subluxation of distal end of right ulna, initial encounter     Rx / DC Orders   ED Discharge Orders     None        Note:  This document was prepared using Dragon voice recognition software and may include  unintentional dictation errors.   Cleaster Tinnie LABOR, PA-C 09/25/24 1536    Levander Slate, MD 09/26/24 [phone]

## 2024-09-25 NOTE — Discharge Instructions (Addendum)
 You were seen in the ED today for evaluation of your right wrist pain. Your ulna appeared to be out of place on the initial xray. I was able to push it back into place and this is confirmed on the second xray. Please keep the splint in place and keep it clean and dry. Schedule a follow up appointment with Dr. Ezra who's information is attached. Take the pain medication as needed.   Return to the ED with any worsening symptoms.

## 2024-09-25 NOTE — ED Triage Notes (Addendum)
 Pt to Ed via POV from [facility]. Pt seen at [facility] on Tuesday and dx with dislocation of distal end of right ulna. Pt reports yesterday started to have increase pain and swelling of right wrist and hand. Pt last took pain medication at 9pm last night without relief. Pt reports ortho has not called for follow up in regard to surgery

## 2024-09-25 NOTE — ED Notes (Addendum)
 Pt states they want this visit to be workers comp. Pt states they work at [employer] and were trying to stop a child from running away. The child then fell on pt's arm and twisted pt's wrist.

## 2024-09-25 NOTE — ED Notes (Signed)
 Pt left after 3rd XRAY. Pt will be notified for any updates on imaging. Pt was aware of the reasoning for the additional XRAY and d/c instructions were discussed prior to pt leaving AMA before scan results. Sling in tact upon d/c

## 2024-09-25 NOTE — ED Provider Notes (Addendum)
 [facility] supervisory note  Notified by [employer] that patient presented with dislocation of distal end of right ulna.  Case was reviewed with Dr. Ezra with orthopedics/hand who recommended sedation for reduction.   I reviewed patient's urgent care visit.  Had x-Gwenetta Devos performed concerning for ulnar dislocation.  Case was discussed with Dr. Kathlynn with orthopedics who recommended splint and outpatient follow-up.  Patient presented back given uncontrolled pain.  I am unable to see the patient's images from urgent care, but I do see radiology read from her xr from 12/2.  FINDINGS:  No acute fracture.  There is dorsal dislocation of the distal ulna.  Regional soft tissues unremarkable.  No aggressive osseus lesion.   I did also discuss the case with Dr. Ezra.  In the setting of a dorsal displacement, did recommend dorsal to volar pressure with placement in a sugar-tong splint in supination.  X-Brittay Mogle here was obtained which was read as a subluxation of the ulna without clear dislocation.  However, given ongoing pain and concerns for dislocation on recent x-Tashana Haberl did proceed with sedation as below.  See [facility] note for reduction.  Reduction x-Alessander Sikorski does demonstrate improved alignment on my review.  Patient placed on sugar-tong splint.  Patient did return to her baseline mental status following propofol  sedation.  Will be given information for follow-up with orthopedics.  .Sedation  Date/Time: 09/25/2024 12:33 PM  Performed by: Levander Slate, MD Authorized by: Levander Slate, MD   Consent:    Consent obtained:  Written   Consent given by:  Patient   Risks discussed:  Vomiting, respiratory compromise necessitating ventilatory assistance and intubation, prolonged sedation necessitating reversal, inadequate sedation and nausea   Alternatives discussed:  Analgesia without sedation Universal protocol:    Immediately prior to procedure, a time out was called: yes   Indications:    Procedure performed:  Dislocation reduction    Procedure necessitating sedation performed by:  Physician performing sedation Pre-sedation assessment:    Time since last food or drink:  >2 hours for water, cookie this AM, otherwise food last night   ASA classification: class 2 - patient with mild systemic disease     Mallampati score:  II - soft palate, uvula, fauces visible   Pre-sedation assessments completed and reviewed: airway patency, cardiovascular function, hydration status, mental status, nausea/vomiting, pain level, respiratory function and temperature   A pre-sedation assessment was completed prior to the start of the procedure Immediate pre-procedure details:    Reviewed: NPO status   Procedure details (see MAR for exact dosages):    Preoxygenation:  Nasal cannula   Sedation:  Propofol    Intended level of sedation: moderate (conscious sedation)   Intra-procedure monitoring:  Blood pressure monitoring, continuous capnometry, frequent LOC assessments, continuous pulse oximetry, cardiac monitor and frequent vital sign checks   Intra-procedure events: none     Total Provider sedation time (minutes):  23 Post-procedure details:   A post-sedation assessment was completed following the completion of the procedure.   Recovery: Patient returned to pre-procedure baseline     Procedure completion:  Tolerated well, no immediate complications      Levander Slate, MD 09/25/24 1235
